# Patient Record
Sex: Female | Born: 1940 | Race: White | Hispanic: No | Marital: Married | Smoking: Never smoker
Health system: Southern US, Community
[De-identification: ages and names within clinical notes are randomized; demographics above are authoritative.]

## PROBLEM LIST (undated history)

## (undated) DIAGNOSIS — I10 Essential (primary) hypertension: Secondary | ICD-10-CM

## (undated) DIAGNOSIS — I499 Cardiac arrhythmia, unspecified: Secondary | ICD-10-CM

## (undated) DIAGNOSIS — IMO0002 Reserved for concepts with insufficient information to code with codable children: Secondary | ICD-10-CM

## (undated) DIAGNOSIS — N321 Vesicointestinal fistula: Secondary | ICD-10-CM

## (undated) DIAGNOSIS — K5790 Diverticulosis of intestine, part unspecified, without perforation or abscess without bleeding: Secondary | ICD-10-CM

## (undated) DIAGNOSIS — M1990 Unspecified osteoarthritis, unspecified site: Secondary | ICD-10-CM

## (undated) HISTORY — PX: BREAST BIOPSY: SHX20

## (undated) HISTORY — PX: TONSILLECTOMY AND ADENOIDECTOMY: SUR1326

## (undated) HISTORY — PX: DILATION AND CURETTAGE OF UTERUS: SHX78

## (undated) HISTORY — DX: Essential (primary) hypertension: I10

## (undated) HISTORY — DX: Reserved for concepts with insufficient information to code with codable children: IMO0002

---

## 1994-02-17 HISTORY — PX: CHOLECYSTECTOMY: SHX55

## 1998-06-15 ENCOUNTER — Other Ambulatory Visit: Admission: RE | Admit: 1998-06-15 | Discharge: 1998-06-15 | Payer: Self-pay | Admitting: *Deleted

## 1999-08-28 ENCOUNTER — Other Ambulatory Visit: Admission: RE | Admit: 1999-08-28 | Discharge: 1999-08-28 | Payer: Self-pay | Admitting: Radiology

## 1999-09-25 ENCOUNTER — Encounter: Payer: Self-pay | Admitting: Geriatric Medicine

## 1999-09-25 ENCOUNTER — Inpatient Hospital Stay (HOSPITAL_COMMUNITY): Admission: EM | Admit: 1999-09-25 | Discharge: 1999-09-28 | Payer: Self-pay | Admitting: Internal Medicine

## 1999-11-15 ENCOUNTER — Other Ambulatory Visit: Admission: RE | Admit: 1999-11-15 | Discharge: 1999-11-15 | Payer: Self-pay | Admitting: *Deleted

## 2001-05-19 ENCOUNTER — Other Ambulatory Visit: Admission: RE | Admit: 2001-05-19 | Discharge: 2001-05-19 | Payer: Self-pay | Admitting: *Deleted

## 2010-03-10 ENCOUNTER — Encounter: Payer: Self-pay | Admitting: Family Medicine

## 2010-06-28 ENCOUNTER — Other Ambulatory Visit: Payer: Self-pay | Admitting: Obstetrics and Gynecology

## 2010-06-28 ENCOUNTER — Encounter (HOSPITAL_COMMUNITY): Payer: Medicare Other

## 2010-06-28 LAB — BASIC METABOLIC PANEL
BUN: 17 mg/dL (ref 6–23)
CO2: 29 mEq/L (ref 19–32)
Calcium: 9.5 mg/dL (ref 8.4–10.5)
Chloride: 92 mEq/L — ABNORMAL LOW (ref 96–112)
Creatinine, Ser: 0.66 mg/dL (ref 0.4–1.2)
GFR calc Af Amer: >60 mL/min (ref >60)
GFR calc non Af Amer: >60 mL/min (ref >60)
Glucose, Bld: 107 mg/dL — ABNORMAL HIGH (ref 70–99)
Potassium: 3.1 mEq/L — ABNORMAL LOW (ref 3.5–5.1)
Sodium: 132 mEq/L — ABNORMAL LOW (ref 135–145)

## 2010-06-28 LAB — CBC
HCT: 41.4 % (ref 36.0–46.0)
Hemoglobin: 14 g/dL (ref 12.0–15.0)
MCH: 28.9 pg (ref 26.0–34.0)
MCHC: 33.8 g/dL (ref 30.0–36.0)
MCV: 85.5 fL (ref 78.0–100.0)
Platelets: 305 10*3/uL (ref 150–400)
RBC: 4.84 MIL/uL (ref 3.87–5.11)
RDW: 12.8 % (ref 11.5–15.5)
WBC: 7.9 10*3/uL (ref 4.0–10.5)

## 2010-07-04 ENCOUNTER — Ambulatory Visit (HOSPITAL_COMMUNITY)
Admission: RE | Admit: 2010-07-04 | Discharge: 2010-07-04 | Disposition: A | Discharge status: Home / Self Care | Payer: Medicare Other | Source: Ambulatory Visit | Attending: Obstetrics and Gynecology | Admitting: Obstetrics and Gynecology

## 2010-07-04 ENCOUNTER — Other Ambulatory Visit: Payer: Self-pay | Admitting: Obstetrics and Gynecology

## 2010-07-04 DIAGNOSIS — Z01812 Encounter for preprocedural laboratory examination: Secondary | ICD-10-CM | POA: Insufficient documentation

## 2010-07-04 DIAGNOSIS — N950 Postmenopausal bleeding: Secondary | ICD-10-CM | POA: Insufficient documentation

## 2010-07-04 DIAGNOSIS — N840 Polyp of corpus uteri: Secondary | ICD-10-CM | POA: Insufficient documentation

## 2010-07-04 DIAGNOSIS — I10 Essential (primary) hypertension: Secondary | ICD-10-CM | POA: Insufficient documentation

## 2010-07-04 DIAGNOSIS — Z01818 Encounter for other preprocedural examination: Secondary | ICD-10-CM | POA: Insufficient documentation

## 2010-07-11 NOTE — H&P (Signed)
[name], Hays              ACCOUNT NO.:  [id number]  MEDICAL RECORD NO.:  [id number]           PATIENT TYPE:  LOCATION:                                 FACILITY:  PHYSICIAN:  Hal Morales, M.D.DATE OF BIRTH:  August 05, 1940  DATE OF ADMISSION:  07/03/2010 DATE OF DISCHARGE:                             HISTORY & PHYSICAL   HISTORY OF PRESENT ILLNESS:  The patient is a 70 year old white married female para 2-0-0-2 who presents for removal of a probable endometrial polyp.  The patient presented on April 25, 2010, complaining of a 1-year history of thick discharge after wiping.  She had had this on several occasions separated by many months; however, in February 2012, she started to have almost daily rusty watery discharge.  She knows that that bleeding has increased over the last week to some bright red bleeding.  She denied any nausea, vomiting, constipation, diarrhea, or abdominal pain.  She has had two urinary tract infections since September 2011. She underwent workup for this abnormal postmenopausal uterine bleeding, which included the following:  A Pap smear done on April 25, 2010, was negative for intraepithelial lesions or malignancy.  She did at the time of her examination have a cervical polyp, the pathology of which showed benign mixed endometrial endocervical polyp.  She underwent a sonohysterogram showing the uterus measuring 9.12 cm x 4.02 cm x 3.85 cm.  The endometrial thickness was 14 mm.  The ovaries were within normal limits.  There was an intracavitary polyp measuring 3.4 cm x 1.7 cm.  At that time, the recommendation was made for hysteroscopic polyp removal.  The patient was on route to vacation and opted to schedule this procedure subsequent to her medication.  GYNECOLOGICAL HISTORY:  The patient underwent menarche at age 27, but underwent menopause approximately 20 years ago.  She denies any history of abnormal Pap smears in the past.  She had not had  a Pap smear prior to her recent one over 10 years.  OBSTETRICAL HISTORY:  The patient had two spontaneous vaginal deliveries.  SURGICAL HISTORY:  Tonsillectomy and adenoidectomy and gallbladder removal.  MEDICAL HISTORY:  Chronic hypertension, currently under treatment.  FAMILY HISTORY:  Positive for hypertension and diabetes.  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  She does not smoke cigarettes and rarely drinks alcohol.  CURRENT MEDICATIONS:  Toprol-XL and hydrochlorothiazide.  DRUG SENSITIVITIES:  SULFA.  REVIEW OF SYSTEMS:  Negative except as mentioned above.  The patient did have an episode of bronchitis approximately a week ago and completed a Z- Pak on Jun 29, 2010.  PHYSICAL EXAMINATION:  GENERAL:  The patient is a well-developed white female in no acute distress. VITAL SIGNS:  Temperature is 97.6, respirations 14, pulse 72, height 5 feet 1/2 inch, weight 190 pounds, BMI 36, blood pressure 118/88. LUNGS:  Clear. HEART:  Regular rate and rhythm. ABDOMEN:  Protuberant with limitation on examination, but no tenderness or masses are palpated. PELVIC:  External genitalia, Bartholin, urethral, and Skene are within normal limits.  The vagina is atrophic.  The cervix is without gross lesions, status post  removal of the aforementioned 3 cm polyp.  The uterus does not feel significantly enlarged.  Adnexa, no palpable masses.  Rectovaginal, no masses.  IMPRESSION: 1. Over 1 year history of postmenopausal bleeding and spotting. 2. History of benign mixed endocervical and endometrial polyp. 3. Evidence of current endometrial polyp. 4. Chronic hypertension. 5. Sulfa allergy.  DISPOSITION:  A long discussion was held with the patient concerning recommendations for hysteroscopic polyp resection and uterine curettage. The risks of anesthesia, bleeding, infection, damage to adjacent organs, and uterine perforation were all reviewed with the patient, and  she seemed to understand and had her questions answered.  She wishes to proceed.  This will be done at [facility] on Jul 04, 2010.     Hal Morales, M.D.     VPH/MEDQ  D:  07/03/2010  T:  07/04/2010  Job:  [id number]  Electronically Signed by Dierdre Forth M.D. on 07/11/2010 11:21:10 PM

## 2010-07-11 NOTE — Consult Note (Signed)
  [name], [name]              ACCOUNT NO.:  [id number]  MEDICAL RECORD NO.:  [id number]           PATIENT TYPE:  O  LOCATION:  [facility]                          FACILITY:  [facility]  PHYSICIAN:  Hal Morales, M.D.DATE OF BIRTH:  1940/07/10  DATE OF CONSULTATION:  07/04/2010 DATE OF DISCHARGE:                                CONSULTATION   PREOPERATIVE DIAGNOSES:  Postmenopausal bleeding and endometrial mass.  POSTOPERATIVE DIAGNOSES:  Postmenopausal bleeding and endometrial mass.  OPERATION:  Hysteroscopy with resection of endometrial mass and D and C.  SURGEON:  Hal Morales, MD.  ANESTHESIA:  General and local.  FINDINGS:  The patient had multiple large endometrial masses and a fluffy endometrium in general.  PROCEDURE:  The patient was taken to the operating room after appropriate identification and placed on the operating table.  After the attainment of adequate general anesthesia, she was placed in a lithotomy position.  The perineum and vagina were prepped with multiple layers of Betadine and draped as a sterile field.  A red Robinson catheter was used to empty the bladder.  A Graves speculum was placed in the vagina and a paracervical block achieved with a total of 10 mL of 2% Xylocaine in the 5 and 7 o'clock positions.  A single-tooth tenaculum was placed on the anterior cervix.  The uterus was sounded to 8 cm.  The cervix was dilated to accommodate the diagnostic hysteroscope and the above-noted findings made and documented.  The operative hysteroscope was then placed and resection of the largest lesion begun.  The lesion was then removed with Randall stone forceps and curettage.  The hysteroscope was reinserted and the remainder of the endometrial lesions were removed with cautery.  Hemostasis seemed adequate.  The uterine bed was then curetted once again for a large amount of tissue.  Once the curettings were down to what appeared to be the base of the  endometrium, all instruments were removed from the vagina, and the patient awakened from general anesthesia, then taken to the recovery room in satisfactory condition having tolerated the procedure well with sponge and instrument counts correct.  Specimens to pathology endometrial mass, endometrial curettings, and polyp.  Discharge instructions are printed instructions for hysteroscopy, D and C from the [facility].  DISCHARGE MEDICATIONS:  Ibuprofen 600 mg p.o. q.6 h. for 3 days and then q.6 hours p.r.n. pain which was called in to [facility] with no refill.  FOLLOWUP:  The patient will follow up in 2 weeks with Dr. Pennie Rushing.     Hal Morales, M.D.     VPH/MEDQ  D:  07/04/2010  T:  07/04/2010  Job:  [id number]  Electronically Signed by Dierdre Forth M.D. on 07/11/2010 11:21:18 PM

## 2010-07-16 ENCOUNTER — Ambulatory Visit: Payer: Medicare Other | Attending: Gynecologic Oncology | Admitting: Gynecologic Oncology

## 2010-07-16 DIAGNOSIS — Z808 Family history of malignant neoplasm of other organs or systems: Secondary | ICD-10-CM | POA: Insufficient documentation

## 2010-07-16 DIAGNOSIS — C549 Malignant neoplasm of corpus uteri, unspecified: Secondary | ICD-10-CM | POA: Insufficient documentation

## 2010-07-16 DIAGNOSIS — Z803 Family history of malignant neoplasm of breast: Secondary | ICD-10-CM | POA: Insufficient documentation

## 2010-07-17 NOTE — Consult Note (Signed)
Joanna Hays, Joanna Hays              ACCOUNT NO.:  [id number]  MEDICAL RECORD NO.:  [id number]           PATIENT TYPE:  O  LOCATION:  DAY                          FACILITY:  [facility]  PHYSICIAN:  Laurette Schimke, MD     DATE OF BIRTH:  07-08-1940  DATE OF CONSULTATION: DATE OF DISCHARGE:                                CONSULTATION   REASON FOR VISIT:  Consult was requested by Dr. Pennie Rushing for evaluation of a grade II endometrioid adenocarcinoma.  HISTORY OF PRESENT ILLNESS:  This is a 70 year old gravida 2, para 2 female, last normal menstrual period approximately age of 58.  She reported vaginal spotting intermittently beginning in March 2011.  The spotting became more concerning and she initiated evaluation in March 2012.  An endometrial biopsy was collected on Jul 04, 2010, was demonstrated a grade II endometrioid adenocarcinoma.  This is in a background of polypoid endometrial fragments.  There is possibility that the tumor may be arising in a polyp.  An additional endometrial polyp was removed and showed complex atypical hyperplasia.  PAST MEDICAL HISTORY:  Hypertension for 10 years.  PAST SURGICAL HISTORY: 1. Cholecystectomy in 1996. 2. Tonsillectomy and adenoidectomy at the age of 30.  PAST GYN HISTORY:  Menarche occurred at age of 89 and was regular until menopause.  She reports oral contraceptive pill use for 2 years duration in the remote past and the use of a lapis lazuli for approximately 19 years.  She denies any history of abnormal Pap tests.  SOCIAL HISTORY:  She is a retired Publishing rights manager, previously worked with [employer].  She reports rare alcohol use and denies tobacco use.  Her children are alive and well.  She is accompanied by her husband.  FAMILY HISTORY:  Notable for maternal uncle with brain cancer in his 35s and the third cousin with breast cancer diagnosed in 72s.  ALLERGIES:  SULFA.  MEDICATIONS: 1. Toprol XL. 2.  Hydrochlorothiazide.  REVIEW OF SYSTEMS:  Intermittent vaginal spotting.  No weight loss.  No nausea, vomiting.  No diarrhea, constipation or changes in bowel habits. No abdominal distention, cough, shortness of breath, headache, hematochezia or hematuria.  No lower extremity edema.  PHYSICAL EXAMINATION:  GENERAL:  Well-developed pleasant female, in no acute distress. VITAL SIGNS:  Weight 186 pounds, height 5 feet 1 inch, blood pressure 138/72, pulse of 72. CHEST:  Clear to auscultation. HEART:  Regular rate and rhythm. ABDOMEN:  Soft, obese, nontender. BACK:  No CVA tenderness. PELVIC EXAMINATION:  Normal external genitalia, Bartholin's, urethra and scant blood within the vaginal vault.  No cervical or vaginal metastatic disease appreciated.  Soft cervix.  No parametrial induration.  No nodularity in the cul-de-sac. RECTAL EXAMINATION:  Good anal sphincter tone without any masses.  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  The options presented to her were that of surgical staging versus the other less recommended approaches such as primary radiation or hormonal therapy. Patient opts for minimally invasive surgical staging approach.  The proposed procedure is that of a robotic-assisted laparoscopic hysterectomy, bilateral salpingo-oophorectomy  and likely lymph node dissection.  The risks and benefits of the surgery were discussed with and her husband and are inclusive of infection, bleeding, damage to surrounding structures, prolonged hospitalization, reoperation.  All of her questions were answered to her satisfaction and she is aware that the procedure will be performed by Dr. Rockney Ghee on July 23, 2010.     Laurette Schimke, MD     WB/MEDQ  D:  07/16/2010  T:  07/16/2010  Job:  [id number]  cc:   Hal Morales, M.D. Fax: [phone]  Telford Nab, R.N. [address] [place], [place] [address]  Electronically Signed by Laurette Schimke MD on 07/17/2010 08:58:45 AM

## 2010-07-19 ENCOUNTER — Other Ambulatory Visit: Payer: Self-pay | Admitting: Gynecologic Oncology

## 2010-07-19 ENCOUNTER — Ambulatory Visit (HOSPITAL_COMMUNITY)
Admission: RE | Admit: 2010-07-19 | Discharge: 2010-07-19 | Disposition: A | Discharge status: Home / Self Care | Payer: Medicare Other | Source: Ambulatory Visit | Attending: Obstetrics & Gynecology | Admitting: Obstetrics & Gynecology

## 2010-07-19 ENCOUNTER — Encounter (HOSPITAL_COMMUNITY): Payer: Medicare Other

## 2010-07-19 ENCOUNTER — Other Ambulatory Visit: Payer: Self-pay | Admitting: Obstetrics & Gynecology

## 2010-07-19 DIAGNOSIS — Z01812 Encounter for preprocedural laboratory examination: Secondary | ICD-10-CM | POA: Insufficient documentation

## 2010-07-19 DIAGNOSIS — I10 Essential (primary) hypertension: Secondary | ICD-10-CM

## 2010-07-19 DIAGNOSIS — Z01811 Encounter for preprocedural respiratory examination: Secondary | ICD-10-CM

## 2010-07-19 DIAGNOSIS — Z01818 Encounter for other preprocedural examination: Secondary | ICD-10-CM | POA: Insufficient documentation

## 2010-07-19 HISTORY — PX: ABDOMINAL HYSTERECTOMY: SHX81

## 2010-07-19 LAB — COMPREHENSIVE METABOLIC PANEL
ALT: 23 U/L (ref 0–35)
Albumin: 4.2 g/dL (ref 3.5–5.2)
Alkaline Phosphatase: 123 U/L — ABNORMAL HIGH (ref 39–117)
Calcium: 9.8 mg/dL (ref 8.4–10.5)
Potassium: 3.7 mEq/L (ref 3.5–5.1)
Sodium: 137 mEq/L (ref 135–145)
Total Protein: 8.7 g/dL — ABNORMAL HIGH (ref 6.0–8.3)

## 2010-07-19 LAB — CBC
MCHC: 34.3 g/dL (ref 30.0–36.0)
Platelets: 269 10*3/uL (ref 150–400)
RDW: 13.1 % (ref 11.5–15.5)
WBC: 8 10*3/uL (ref 4.0–10.5)

## 2010-07-19 LAB — SURGICAL PCR SCREEN
MRSA, PCR: NEGATIVE
Staphylococcus aureus: NEGATIVE

## 2010-07-19 LAB — DIFFERENTIAL
Basophils Absolute: 0.1 10*3/uL (ref 0.0–0.1)
Basophils Relative: 1 % (ref 0–1)
Eosinophils Absolute: 0.2 10*3/uL (ref 0.0–0.7)
Eosinophils Relative: 2 % (ref 0–5)

## 2010-07-23 ENCOUNTER — Other Ambulatory Visit: Payer: Self-pay | Admitting: Gynecologic Oncology

## 2010-07-23 ENCOUNTER — Ambulatory Visit (HOSPITAL_COMMUNITY)
Admission: RE | Admit: 2010-07-23 | Discharge: 2010-07-24 | Disposition: A | Discharge status: Home / Self Care | Payer: Medicare Other | Source: Ambulatory Visit | Attending: Obstetrics & Gynecology | Admitting: Obstetrics & Gynecology

## 2010-07-23 DIAGNOSIS — E669 Obesity, unspecified: Secondary | ICD-10-CM | POA: Insufficient documentation

## 2010-07-23 DIAGNOSIS — N8000 Endometriosis of the uterus, unspecified: Secondary | ICD-10-CM | POA: Insufficient documentation

## 2010-07-23 DIAGNOSIS — D259 Leiomyoma of uterus, unspecified: Secondary | ICD-10-CM | POA: Insufficient documentation

## 2010-07-23 DIAGNOSIS — Z9089 Acquired absence of other organs: Secondary | ICD-10-CM | POA: Insufficient documentation

## 2010-07-23 DIAGNOSIS — I10 Essential (primary) hypertension: Secondary | ICD-10-CM | POA: Insufficient documentation

## 2010-07-23 DIAGNOSIS — C549 Malignant neoplasm of corpus uteri, unspecified: Secondary | ICD-10-CM | POA: Insufficient documentation

## 2010-07-23 DIAGNOSIS — Z79899 Other long term (current) drug therapy: Secondary | ICD-10-CM | POA: Insufficient documentation

## 2010-07-23 DIAGNOSIS — IMO0002 Reserved for concepts with insufficient information to code with codable children: Secondary | ICD-10-CM

## 2010-07-23 HISTORY — DX: Reserved for concepts with insufficient information to code with codable children: IMO0002

## 2010-07-23 LAB — CBC
HCT: 37.9 % (ref 36.0–46.0)
Hemoglobin: 13.1 g/dL (ref 12.0–15.0)
MCH: 28.9 pg (ref 26.0–34.0)
MCHC: 34.6 g/dL (ref 30.0–36.0)
RBC: 4.54 MIL/uL (ref 3.87–5.11)

## 2010-07-23 LAB — SAMPLE TO BLOOD BANK

## 2010-07-23 LAB — HEMOGLOBIN AND HEMATOCRIT, BLOOD
HCT: 36.6 % (ref 36.0–46.0)
Hemoglobin: 12.7 g/dL (ref 12.0–15.0)

## 2010-07-24 LAB — BASIC METABOLIC PANEL
CO2: 28 mEq/L (ref 19–32)
Calcium: 9 mg/dL (ref 8.4–10.5)
Chloride: 101 mEq/L (ref 96–112)
GFR calc Af Amer: >60 mL/min (ref >60)
Glucose, Bld: 177 mg/dL — ABNORMAL HIGH (ref 70–99)
Potassium: 3.9 mEq/L (ref 3.5–5.1)
Sodium: 135 mEq/L (ref 135–145)

## 2010-07-24 LAB — CBC
HCT: 33.9 % — ABNORMAL LOW (ref 36.0–46.0)
Hemoglobin: 11.8 g/dL — ABNORMAL LOW (ref 12.0–15.0)
MCHC: 34.8 g/dL (ref 30.0–36.0)
RBC: 4.08 MIL/uL (ref 3.87–5.11)

## 2010-07-25 NOTE — Discharge Summary (Signed)
Joanna Hays, Joanna Hays              ACCOUNT NO.:  [id number]  MEDICAL RECORD NO.:  [id number]  LOCATION:  1525                         FACILITY:  [facility]  PHYSICIAN:  Wyland Rastetter A. Clearance Coots, M.D.DATE OF BIRTH:  October 04, 1940  DATE OF ADMISSION:  07/23/2010 DATE OF DISCHARGE:  07/24/2010                              DISCHARGE SUMMARY   ADMITTING DIAGNOSIS:  Grade 2 endometrial cancer.  DISCHARGE DIAGNOSES:  Grade 2 endometrial cancer status post staging, robotic-assisted total laparoscopic hysterectomy and bilateral salpingo- oophorectomy, with bilateral pelvic and bilateral para-aortic lymphadenectomy.  DISPOSITION:  Discharged home in good condition.  REASON FOR ADMISSION:  This 70 year old female, G2, P2, presents for consultation requested by Dr. Hal Morales for evaluation of a grade 2 endometrioid adenocarcinoma.  The patient's last menstrual period was approximately age 63.  She reported vaginal spotting intermittently beginning in March of 2011.  The spotting became more concerning and she initiated evaluation in March of 2012.  An endometrial biopsy was collected on Jul 04, 2010 and demonstrated a grade 2 endometrioid adenocarcinoma.  This was in the background of polypoid endometrial fragment.  There was a possibility that the tumor was arising in a polyp and an additional endometrial polyp was removed and showed complex atypical hyperplasia.  PAST MEDICAL HISTORY:  Surgeries, cholecystectomy in 1996, tonsillectomy and adenoidectomy at age 42.  Illnesses, hypertension for 10 years.  MEDICATIONS:  Toprol XL, hydrochlorothiazide.  ALLERGIES:  SULFA.  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  She reports rare alcohol use and denies tobacco use.  Her children are alive and well, and she is accompanied by her husband  FAMILY HISTORY:  Notable for maternal uncle with brain cancer in his 92s and  third cousin with breast cancer diagnosed in her 59s.  PAST GYN HISTORY:  Menarche occurred at age 64 and was regular until menopause.  She reports oral contraceptive pill use for 2 years in the remote past and the use of lapis lazuli for approximately 19 years.  She denies any history of abnormal Pap smears.  REVIEW OF SYSTEMS:  Intermittent vaginal spotting with no weight loss, no nausea or vomiting, no diarrhea or constipation or changes in her bowel habits.  No abdominal distention, cough, shortness of breath, headache, hematochezia or hematuria.  No lower extremity edema.  PHYSICAL EXAMINATION:  GENERAL:  Well nourished, well-developed and pleasant female, in no acute distress. VITAL SIGNS:  Weight 186 pounds, height 5 feet 1 inch, blood pressure 138/72, pulse 72. LUNGS:  Clear to auscultation bilaterally. HEART:  Regular rate and rhythm. ABDOMEN:  Obese, soft, nontender. BACK:  No CVA tenderness. PELVIC EXAMINATION:  Normal external genitalia.  Bartholin's, urethra and Skene's glands are all normal.  Scant blood within the vaginal vault.  No cervical or vaginal metastatic disease appreciated.  Cervix was soft with no parametrial induration.  No nodularity in the cul-de- sac. RECTAL EXAMINATION:  Good anal sphincter tone without any masses.  ADMITTING LABORATORY DATA:  Hemoglobin 13.1, hematocrit 37.9, white blood cell count 12,100, platelets 229,000.  Sodium 137, potassium 3.7, glucose 89, BUN 21, creatinine 0.72.  SGOT 22, SGPT 23.  Chest x-ray was within normal limits.  EKG was within normal limits.  HOSPITAL COURSE:  The patient underwent a staging robotic-assisted total laparoscopic hysterectomy and bilateral salpingo-oophorectomy with bilateral pelvic and bilateral para-aortic lymphadenectomies.  There were no intraoperative complications.  Postoperative course was uncomplicated.  The patient was discharged home on postop day #1 in good condition.  DISCHARGE  LABORATORY DATA:  Hemoglobin 11.8, hematocrit 33.9, white blood cell count 16,800, platelets 269,000.  DISCHARGE DISPOSITION:  MEDICATIONS:  Percocet and ibuprofen prescribed for pain.  Continue meds there were taken prior to admission to the hospital. Routine written instructions were given for discharge after laparoscopic hysterectomy.  The patient is to call the [facility] office for a followup appointment.     Yoseph Haile A. Clearance Coots, M.D.     CAH/MEDQ  D:  07/24/2010  T:  07/24/2010  Job:  [id number]  cc:   Hal Morales, M.D. Fax: [phone]  Electronically Signed by Coral Ceo M.D. on 07/25/2010 01:29:31 PM

## 2010-07-25 NOTE — Op Note (Signed)
NAMEJABREE, Joanna Hays              ACCOUNT NO.:  [id number]  MEDICAL RECORD NO.:  [id number]  LOCATION:  DAYL                         FACILITY:  [facility]  PHYSICIAN:  Aziya Arena A. Duard Brady, MD    DATE OF BIRTH:  01-24-1941  DATE OF PROCEDURE:  07/23/2010 DATE OF DISCHARGE:                              OPERATIVE REPORT   PREOPERATIVE DIAGNOSIS:  Grade 2 endometrioid adenocarcinoma.  POSTOPERATIVE. DIAGNOSIS:  Grade 2 endometrioid adenocarcinoma.  PROCEDURE:  Total robotic hysterectomy, bilateral salpingo-oophorectomy, bilateral pelvic and paraaortic lymph node dissection.  SURGEON: 1. Zaleah Ternes A. Duard Brady, MD 2. Roseanna Rainbow, MD  ASSISTANT:  Telford Nab, RN  ANESTHESIA:  General.  ANESTHESIOLOGIST:  Hezzie Bump. Rose, MD  BLOOD LOSS:  100 mL.  URINE OUTPUT:  280 mL.  IV FLUIDS:  2000 mL.  SPECIMEN:  Cervix, uterus, bilateral tubes and ovaries, bilateral pelvic and paraaortic lymph nodes.  DISPOSITION:  Specimens to pathology.  COMPLICATIONS:  None.  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  A 6 cm left-sided hydrosalpinx.  No gross lymphadenopathy.  Fairly significant intra-abdominal obesity.  DESCRIPTION OF PROCEDURE:  The patient was seen in the preoperative area and identified as self.  Informed consent was signed on the chart at the time of the procedure.  The patient was taken to the operating room, placed in the supine position with arms tucked while she was awake to her comfort level to ensure all appropriate precautions were taken. General anesthesia was then induced.  She was then placed in dorsal lithotomy position with all appropriate precautions.  OG tube was then placed.  Time-out was performed to confirm the patient, the procedure, antibiotic, and allergy status.  The perineum was cleansed with Betadine.  Sterile speculum was placed  in the vagina.  The cervix was grasped with single-tooth tenaculum.  It was dilated without difficulty and the ZUMI with a medium balloon was placed.  Foley catheter was inserted in the bladder in sterile condition.  The abdomen was then prepped with ChloraPrep.  Shoulder blocks were placed in usual fashion with Gelfoam.  After waiting 3 minutes, the patient was then draped. Time-out was again performed to confirm the patient, the procedure, antibiotic, allergy status.  Staff was introduced to themselves.  After confirming OG tube placement and to suction, the skin incision 2 cm below the infracostal margin on the left in the midclavicular line was made.  Using a 5 mm Optiview port, we placed the trocar into the abdomen.  The abdomen was insufflated with CO2 gas.  At this point and all points during the case, the patient's intra-abdominal pressure did not increase over 50 mmHg.  She was then placed into deep Trendelenburg position.  A 10/12 camera port was placed 23 cm above the pubic symphysis.  Bilateral 8-mm ports were placed under visualization 10 cm and 15 degrees south of that port and a fourth arm was placed on the left lower quadrant 2 cm medial and superior to the ACIS.  The small bowel was folded on its mesentery to expose the root of the mesentery and  the periaortic region.  The robot was undocked.  First our attention was drawn to the right paraaortic lymph node dissection.  An incision was made over the right side of the common iliac artery and extended to the level of the duodenum.  The ureter was identified in the retroperitoneum and the fourth arm of the robot was used to hold this away.  The nodal bundle extending over the midway of the common superiorly to the duodenum was taken off the artery with pinpoint cautery and meticulous dissection.  It was then dissected off the vena cava.  The area was noted to be hemostatic.  The specimen was then delivered through the 10/12  assistant port.  Our attention was then drawn to the left para-aortics.  We extended the incision that was made along the aorta towards the left common.  This allowed visualization and access to the retroperitoneal space on her left side.  The ureter was identified, the assistant grasper then held the ureter out of the way. The nodal bundle between the ureter and the midportion of the aorta was then removed using pinpoint dissection and electrocautery and delivered through the 10/12 port.  Our attention was then drawn to the pelvis. The adhesive disease of the rectosigmoid colon to the uterus, the round ligament and the left adnexa was taken down using sharp dissection.  The round ligament was transected on the patient's right side.  The anterior and posterior leaves of the broad ligament were opened.  The ureter was identified.  A window was made between the IP and the ureter after dissecting over in the pararectal space.  The IP was skeletonized, coagulated and transected.  We then skeletonized the uterine vessels and created the bladder flap anteriorly.  The uterine vessels were then coagulated, transected. and a C-loop was created.  Our attention was drawn to the left side.  The hydrosalpinx was dissected off the left pelvic sidewall.  The round ligament was then transected.  The anterior and posterior leaves of broad ligament were opened.  The ureter was identified.  A window was made between the IP and the ureter.  The IP was coagulated and transected.  We then completed the skeletonization of the uterine vessels on her left side.  The bladder flap was adequate. The area was cauterized with regards to the uterine vessels and transected using monopolar cautery.  The pneumo-occluder was then insufflated.  The colpotomy was performed without difficulty and the uterus was delivered through the vagina.  The pneumo-occluder balloon was then closed.  The paravesical space was opened on  the patient's right side and the fourth arm was used to keep the paravesical space open.  The nodal bundle exiting over the external iliac artery to the circumflex iliac vein was taken down using sharp dissection.  The genitofemoral nerve was seen and spared.  Our attention was then drawn inferiorly.  The obturator nerve was then identified.  Nodal bundle superior to this was removed without difficulty.  There was some difficulty reaching the deep portion of the nodal bundle under the external iliac vein where it met the internal iliac vein as it was quite prominent vein.  The nodal bundle was removed.  There was some bleeding and a Ray-Tec was placed in that area.  A similar procedure was performed on the patient's left side encompassing the external, internal, and obturator nodal bundles.  The obturator nerve was well seen throughout the case.  The genitofemoral nerve was spared and the ureter  was noted to be coursing well medially. The nodal bundles bilaterally were placed in EndoCatch bag.  We drew our attention again to the right pelvic lymph node area.  There was noted to be some bleeding off the pelvic sidewall.  We then took the vessels off the pelvic sidewall and were able to see the area that was bleeding. However, there was no pedicle for which to coagulate.  A Ray-Tec was then placed and pressure was held.  All specimens were delivered through the vagina including the 3 Ray-Tec and the 2 pelvic lymph node bundles. Removal of the Ray-Tec revealed significant improvement and hemostasis. FloSeal was then placed and pressure was held for 3 minutes and there was noted to be adequate hemostasis.  The pedicle was inspected under low flow and was noted to be hemostatic.  The Ray-Tec were then delivered through the vaginal cuff.  All Ray-Tec counts were correct. The vaginal cuff was closed in a running fashion using a 0 Vicryl on a CT-1 suture.  The needle was then removed from the  abdomen.  All instruments were removed under direct visualization as were the trocars. Under flow with pressure of 3 mmHg, the left pelvic lymph node and right pelvic lymph node areas were identified and noted to be hemostatic.  The camera was removed and all CO2 gas was removed from the patient's abdomen.  Using a 0 Vicryl on a UR-6, deep sutures in the subcu tissues were placed for the 10/12 ports.  The skin was closed using 4-0 Vicryl. Steri-Strips and Benzoin were placed.  The vagina was swabbed and noted to be hemostatic.  The shoulder blocks were removed and the area was atraumatic.  The patient tolerated the procedure well, was taken to recovery room in stable condition.  All instrument, needle, Ray-Tec counts were correct x2.     Shedric Fredericks A. Duard Brady, MD     PAG/MEDQ  D:  07/23/2010  T:  07/23/2010  Job:  [id number]  cc:   Roseanna Rainbow, M.D. Fax: [phone]  Telford Nab, R.N. [address] [place], [place] [address]  Hal Morales, M.D. Fax: [phone]  Electronically Signed by Cleda Mccreedy MD on 07/25/2010 09:13:48 AM

## 2010-08-29 ENCOUNTER — Ambulatory Visit: Payer: Medicare Other | Attending: Gynecologic Oncology | Admitting: Gynecologic Oncology

## 2010-08-29 DIAGNOSIS — Z90710 Acquired absence of both cervix and uterus: Secondary | ICD-10-CM | POA: Insufficient documentation

## 2010-08-29 DIAGNOSIS — C549 Malignant neoplasm of corpus uteri, unspecified: Secondary | ICD-10-CM | POA: Insufficient documentation

## 2010-08-29 DIAGNOSIS — Z9089 Acquired absence of other organs: Secondary | ICD-10-CM | POA: Insufficient documentation

## 2010-08-29 DIAGNOSIS — I10 Essential (primary) hypertension: Secondary | ICD-10-CM | POA: Insufficient documentation

## 2010-08-29 DIAGNOSIS — Z9079 Acquired absence of other genital organ(s): Secondary | ICD-10-CM | POA: Insufficient documentation

## 2010-09-02 NOTE — Consult Note (Signed)
  Joanna Hays, Joanna Hays              ACCOUNT NO.:  [id number]  MEDICAL RECORD NO.:  [id number]  LOCATION:  GYN                          FACILITY:  [facility]  PHYSICIAN:  Laurette Schimke, MD     DATE OF BIRTH:  [birth year]  DATE OF CONSULTATION:  08/29/2010 DATE OF DISCHARGE:                                CONSULTATION   REASON FOR VISIT:  Postoperative check for stage I-A grade 2 endometrioid adenocarcinoma.  HISTORY OF PRESENT ILLNESS:  This is a 70 year old who presented with vaginal bleeding in March 2011.  She was evaluated by Dr. Pennie Rushing and noted to have a grade 2 endometrioid adenocarcinoma on dilation and curettage.  On July 23, 2010, Dr. Duard Brady was the primary surgeon for robotic assisted laparoscopic hysterectomy, bilateral salpingo- oophorectomy, bilateral pelvic and periaortic lymph node dissection. Final pathology was consistent with a microscopic foci of endometrioid adenocarcinoma grade 1 confined within the endometrium without any angiolymphatic invasion.  All lymph nodes were negative.  There was no myometrial invasion.  As such, she is stage I-A grade 2 endometrioid endometrial cancer.  Postoperatively, Joanna Hays reports some spotting that has since resolved.  PAST MEDICAL HISTORY:  Hypertension for 10 years and stage I-A grade 2 endometrial cancer.  PAST SURGICAL HISTORY:  Cholecystectomy 1996, tonsillectomy and adenoidectomy at age of 48, robotic assisted laparoscopic hysterectomy, bilateral salpingo-oophorectomy, bilateral pelvic and periaortic lymph node dissection in June 2012.  PAST GYNECOLOGIC HISTORY:  No changes.  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  FAMILY HISTORY:  No interval changes.  REVIEW OF SYSTEMS:  Vaginal spotting is resolved.  No vaginal discharge. Denies bleeding from the bladder or rectum.  No nausea, vomiting, diarrhea, constipation, abdominal pain.  PHYSICAL EXAMINATION:   GENERAL:  Well-developed female in no acute distress. VITAL SIGNS:  Blood pressure 100/78, pulse of 64, temperature 98.5, height 5 feet 2 inches, weight 186 pounds. CHEST:  Clear to auscultation. ABDOMEN:  Soft, nontender.  Robotic port sites nontender without erythema or evidence of a hernia. PELVIC:  Normal external genitalia, Bartholin's, urethral and Skene's. Vaginal cuff is intact.  No pelvic masses are appreciated.  IMPRESSION:  Joanna Hays is 5 weeks status post robotic assisted laparoscopic hysterectomy, bilateral salpingo-oophorectomy, bilateral pelvic and periaortic lymph node dissection for stage I-A, grade 2 endometrial cancer.  She is doing very well.  I have advised her to follow up with Dr. Pennie Rushing in 6 months and with GYN Oncology Service in 1 year.  Annual Paps are necessary, bi-annual pelvic examinations are necessary.  Imaging or biomarkers assays are only necessary as indicated by physical or clinical symptomatology.     Laurette Schimke, MD     WB/MEDQ  D:  08/29/2010  T:  08/29/2010  Job:  [id number]  cc:   Hal Morales, M.D. Fax: [phone] Telford Nab, R.N. [address] [place], [place] [address]  Electronically Signed by Laurette Schimke MD on 09/02/2010 10:00:55 AM

## 2011-03-03 ENCOUNTER — Other Ambulatory Visit: Payer: Self-pay | Admitting: Internal Medicine

## 2011-03-03 DIAGNOSIS — R1032 Left lower quadrant pain: Secondary | ICD-10-CM

## 2011-03-04 ENCOUNTER — Ambulatory Visit
Admission: RE | Admit: 2011-03-04 | Discharge: 2011-03-04 | Disposition: A | Discharge status: Home / Self Care | Payer: Medicare Other | Source: Ambulatory Visit | Attending: Internal Medicine | Admitting: Internal Medicine

## 2011-03-04 DIAGNOSIS — R1032 Left lower quadrant pain: Secondary | ICD-10-CM

## 2011-03-04 MED ORDER — IOHEXOL 300 MG/ML  SOLN
100.0000 mL | Freq: Once | INTRAMUSCULAR | Status: AC | PRN
  Administered 2011-03-04: 100 mL via INTRAVENOUS

## 2011-03-12 ENCOUNTER — Ambulatory Visit (HOSPITAL_COMMUNITY)
Admission: RE | Admit: 2011-03-12 | Discharge: 2011-03-12 | Disposition: A | Discharge status: Home / Self Care | Payer: Medicare Other | Source: Ambulatory Visit | Attending: Geriatric Medicine | Admitting: Geriatric Medicine

## 2011-03-12 DIAGNOSIS — M7989 Other specified soft tissue disorders: Secondary | ICD-10-CM | POA: Insufficient documentation

## 2011-03-12 DIAGNOSIS — R609 Edema, unspecified: Secondary | ICD-10-CM

## 2011-07-15 ENCOUNTER — Encounter: Payer: Self-pay | Admitting: Gynecologic Oncology

## 2011-07-17 ENCOUNTER — Ambulatory Visit: Payer: Medicare Other | Attending: Gynecologic Oncology | Admitting: Gynecologic Oncology

## 2011-07-17 ENCOUNTER — Other Ambulatory Visit (HOSPITAL_COMMUNITY)
Admission: RE | Admit: 2011-07-17 | Discharge: 2011-07-17 | Disposition: A | Discharge status: Home / Self Care | Payer: Medicare Other | Source: Ambulatory Visit | Attending: Gynecologic Oncology | Admitting: Gynecologic Oncology

## 2011-07-17 ENCOUNTER — Encounter: Payer: Self-pay | Admitting: Gynecologic Oncology

## 2011-07-17 VITALS — BP 122/80 | HR 64 | Temp 97.5°F | Resp 14 | Ht 62.0 in | Wt 186.8 lb

## 2011-07-17 DIAGNOSIS — Z124 Encounter for screening for malignant neoplasm of cervix: Secondary | ICD-10-CM | POA: Insufficient documentation

## 2011-07-17 DIAGNOSIS — C541 Malignant neoplasm of endometrium: Secondary | ICD-10-CM | POA: Insufficient documentation

## 2011-07-17 DIAGNOSIS — C549 Malignant neoplasm of corpus uteri, unspecified: Secondary | ICD-10-CM | POA: Insufficient documentation

## 2011-07-17 DIAGNOSIS — I10 Essential (primary) hypertension: Secondary | ICD-10-CM | POA: Insufficient documentation

## 2011-07-17 DIAGNOSIS — Z9079 Acquired absence of other genital organ(s): Secondary | ICD-10-CM | POA: Insufficient documentation

## 2011-07-17 DIAGNOSIS — Z90710 Acquired absence of both cervix and uterus: Secondary | ICD-10-CM | POA: Insufficient documentation

## 2011-07-17 NOTE — Progress Notes (Signed)
This is a 71 year old who presented with  vaginal bleeding in March 2011. She was evaluated by Dr. Pennie Rushing and  noted to have a grade 2 endometrioid adenocarcinoma on dilation and  curettage. On July 23, 2010, Dr. Duard Brady was the primary surgeon for  robotic assisted laparoscopic hysterectomy, bilateral salpingo-  oophorectomy, bilateral pelvic and periaortic lymph node dissection.  Final pathology was consistent with a microscopic foci of endometrioid  adenocarcinoma grade 1 confined within the endometrium without any  angiolymphatic invasion. All lymph nodes were negative. There was no  myometrial invasion. As such, she is stage I-A grade 2 endometrioid  endometrial cancer.    PAST MEDICAL HISTORY: Hypertension for 10 years and stage I-A grade 2  endometrial cancer.  Diverticulitis with microperforation 02/2011  PAST SURGICAL HISTORY: Cholecystectomy 1996, tonsillectomy and  adenoidectomy at age of 60, robotic assisted laparoscopic hysterectomy,  bilateral salpingo-oophorectomy, bilateral pelvic and periaortic lymph  node dissection in June 2012.   PAST GYNECOLOGIC HISTORY: No changes.   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   FAMILY HISTORY: No interval changes.   REVIEW OF SYSTEMS:  No vaginal bleeding or discharge.  Denies bleeding from the bladder or rectum. No nausea, vomiting,  diarrhea, constipation, abdominal pain, sob weight loss, abdominal bloating.  Otherwise 10 point ROS is negative PHYSICAL EXAMINATION: GENERAL: Well-developed female in no acute  distress.  VITAL SIGNS:.BP 122/80  Pulse 64  Temp(Src) 97.5 F (36.4 C) (Oral)  Resp 14  Ht 5\' 2"  (1.575 m)  Wt 186 lb 12.8 oz (84.732 kg)  BMI 34.17 kg/m2 CHEST: Clear to auscultation.  ABDOMEN: Soft, nontender. Robotic port sites nontender without  erythema or evidence of a hernia.  PELVIC: Normal external genitalia, Bartholin's, urethral and Skene's.    Vaginal cuff is intact. No pelvic masses are appreciated.  RECTAL;  Good tone, no masses, no tenderness.  IMPRESSION: Joanna Hays is 1 year status post robotic assisted  laparoscopic hysterectomy, bilateral salpingo-oophorectomy, bilateral  pelvic and periaortic lymph node dissection for stage I-A, grade 2  endometrial cancer.   She Is NED  Follow up with Dr. Pennie Rushing in 6 months and with GYN Oncology Service in 1 year.  Annual Pap collected today. Annual bi-annual pelvic examinations are necessary. Imaging or biomarkers assays are only necessary as indicated by physical or  clinical symptomatology.  Signs and symptoms of recurrence d/w patient

## 2011-07-17 NOTE — Patient Instructions (Signed)
No evidence of disease  Follow up with Dr. Pennie Rushing in 6 months and with GYN Oncology Service in 1 year.  Annual Pap collected today. Annual bi-annual pelvic examinations are necessary. Imaging or biomarkers assays are only necessary as indicated by physical or  clinical symptomatology.  Signs and symptoms of recurrence d/w patinet

## 2011-07-22 ENCOUNTER — Telehealth: Payer: Self-pay | Admitting: Gynecologic Oncology

## 2011-07-22 NOTE — Telephone Encounter (Signed)
Pt notified about pap results: negative.  No questions or concerns voiced. 

## 2012-03-03 ENCOUNTER — Encounter: Payer: Self-pay | Admitting: Obstetrics and Gynecology

## 2012-03-03 ENCOUNTER — Ambulatory Visit: Payer: Medicare Other | Admitting: Obstetrics and Gynecology

## 2012-03-03 VITALS — BP 134/70 | HR 60 | Ht 60.0 in | Wt 192.0 lb

## 2012-03-03 DIAGNOSIS — C541 Malignant neoplasm of endometrium: Secondary | ICD-10-CM

## 2012-03-03 NOTE — Progress Notes (Signed)
Subjective:  Last Pap: 06/2011 per pt at cancer center WNL: Yes Regular Periods:no Contraception: post menopausal   Monthly Breast exam:yes Tetanus<59yrs:yes Nl.Bladder Function:yes Daily BMs:yes Healthy Diet:yes Calcium:no Mammogram:yes Date of Mammogram: 2012 per pt Exercise:yes Have often Exercise: swim class 3 days per week and walking  Seatbelt: yes Abuse at home: no Stressful work:no Sigmoid-colonoscopy: Pt has not had one.  Had CT scan with diverticulitis and abscess in 02/2011 Bone Density: Yes, a few years ago per pt PCP: Dr. Pete Glatter Change in PMH: no changes Change in [facility]: no changes  Joanna Hays is a 72 y.o. female G2P2 who presents for annual exam.  The patient has no complaints today.   The following portions of the patient's history were reviewed and updated as appropriate: allergies, current medications, past family history, past medical history, past social history, past surgical history and problem list.  Review of Systems Pertinent items are noted in HPI. Gastrointestinal:No change in bowel habits, no abdominal pain, no rectal bleeding Genitourinary:negative for dysuria, frequency, hematuria, nocturia and urinary incontinence    Objective:     BP 134/70  Pulse 60  Ht 5' (1.524 m)  Wt 192 lb (87.091 kg)  BMI 37.50 kg/m2  Weight:  Wt Readings from Last 1 Encounters:  03/03/12 192 lb (87.091 kg)     BMI: Body mass index is 37.50 kg/(m^2). General Appearance: Alert, appropriate appearance for age. No acute distress HEENT: Grossly normal Neck / Thyroid: Supple, no masses, nodes or enlargement Lungs: clear to auscultation bilaterally Back: No CVA tenderness Breast Exam: No masses or nodes.No dimpling, nipple retraction or discharge. Cardiovascular: Regular rate and rhythm. S1, S2, no murmur Gastrointestinal: Soft, non-tender, no masses or organomegaly Pelvic Exam: Vulva and vagina appear atrophic.  Vaginal vault is well healed and suspended.  Bimanual exam reveals surgically absent uterus and adnexa. Rectovaginal: normal rectal, no masses Lymphatic Exam: Non-palpable nodes in neck, clavicular, axillary, or inguinal regions Skin: no rash or abnormalities Neurologic: Normal gait and speech, no tremor  Psychiatric: Alert and oriented, appropriate affect.    Urinalysis:Not done    Assessment:    endometrial cancer s/p robotic hyst and adnexectomy.  NED    Plan:   mammogram pap smear DUE 06/2102 return FOR pap    Dierdre Forth MD

## 2013-05-04 ENCOUNTER — Other Ambulatory Visit: Payer: Self-pay | Admitting: Gastroenterology

## 2013-12-19 ENCOUNTER — Encounter: Payer: Self-pay | Admitting: Obstetrics and Gynecology

## 2015-04-04 ENCOUNTER — Other Ambulatory Visit: Payer: Self-pay | Admitting: Obstetrics and Gynecology

## 2015-04-04 DIAGNOSIS — R921 Mammographic calcification found on diagnostic imaging of breast: Secondary | ICD-10-CM

## 2015-04-04 DIAGNOSIS — R928 Other abnormal and inconclusive findings on diagnostic imaging of breast: Secondary | ICD-10-CM

## 2015-04-10 ENCOUNTER — Other Ambulatory Visit: Payer: Self-pay | Admitting: Obstetrics and Gynecology

## 2015-04-10 ENCOUNTER — Ambulatory Visit
Admission: RE | Admit: 2015-04-10 | Discharge: 2015-04-10 | Disposition: A | Discharge status: Home / Self Care | Payer: Medicare Other | Source: Ambulatory Visit | Attending: Obstetrics and Gynecology | Admitting: Obstetrics and Gynecology

## 2015-04-10 DIAGNOSIS — R928 Other abnormal and inconclusive findings on diagnostic imaging of breast: Secondary | ICD-10-CM

## 2015-04-10 DIAGNOSIS — R921 Mammographic calcification found on diagnostic imaging of breast: Secondary | ICD-10-CM

## 2015-04-18 ENCOUNTER — Other Ambulatory Visit: Payer: Self-pay | Admitting: Obstetrics and Gynecology

## 2015-04-18 DIAGNOSIS — R921 Mammographic calcification found on diagnostic imaging of breast: Secondary | ICD-10-CM

## 2015-04-18 DIAGNOSIS — R928 Other abnormal and inconclusive findings on diagnostic imaging of breast: Secondary | ICD-10-CM

## 2015-04-19 ENCOUNTER — Ambulatory Visit
Admission: RE | Admit: 2015-04-19 | Discharge: 2015-04-19 | Disposition: A | Discharge status: Home / Self Care | Payer: Medicare Other | Source: Ambulatory Visit | Attending: Obstetrics and Gynecology | Admitting: Obstetrics and Gynecology

## 2015-04-19 ENCOUNTER — Other Ambulatory Visit: Payer: Self-pay | Admitting: Obstetrics and Gynecology

## 2015-04-19 DIAGNOSIS — R928 Other abnormal and inconclusive findings on diagnostic imaging of breast: Secondary | ICD-10-CM

## 2015-04-19 DIAGNOSIS — R921 Mammographic calcification found on diagnostic imaging of breast: Secondary | ICD-10-CM

## 2015-09-14 ENCOUNTER — Other Ambulatory Visit: Payer: Self-pay | Admitting: Obstetrics and Gynecology

## 2015-09-14 DIAGNOSIS — R921 Mammographic calcification found on diagnostic imaging of breast: Secondary | ICD-10-CM

## 2015-10-29 ENCOUNTER — Ambulatory Visit
Admission: RE | Admit: 2015-10-29 | Discharge: 2015-10-29 | Disposition: A | Discharge status: Home / Self Care | Payer: Medicare Other | Source: Ambulatory Visit | Attending: Obstetrics and Gynecology | Admitting: Obstetrics and Gynecology

## 2015-10-29 DIAGNOSIS — R921 Mammographic calcification found on diagnostic imaging of breast: Secondary | ICD-10-CM

## 2016-03-21 ENCOUNTER — Other Ambulatory Visit: Payer: Self-pay | Admitting: Obstetrics and Gynecology

## 2016-03-21 DIAGNOSIS — R921 Mammographic calcification found on diagnostic imaging of breast: Secondary | ICD-10-CM

## 2016-04-03 ENCOUNTER — Ambulatory Visit
Admission: RE | Admit: 2016-04-03 | Discharge: 2016-04-03 | Disposition: A | Discharge status: Home / Self Care | Payer: Medicare Other | Source: Ambulatory Visit | Attending: Obstetrics and Gynecology | Admitting: Obstetrics and Gynecology

## 2016-04-03 DIAGNOSIS — R921 Mammographic calcification found on diagnostic imaging of breast: Secondary | ICD-10-CM

## 2016-12-09 ENCOUNTER — Other Ambulatory Visit: Payer: Self-pay | Admitting: Orthopedic Surgery

## 2017-01-13 NOTE — Pre-Procedure Instructions (Signed)
[place]  01/13/2017      [place], [place] [address] Phone: [phone] Fax: [phone]    Your procedure is scheduled on Monday December 3.  Report to [facility] Admitting at 8:00 A.M.  Call this number if you have problems the morning of surgery:  [phone]   Remember:  Do not eat food or drink liquids after midnight.  Take these medicines the morning of surgery with A SIP OF WATER: Metoprolol (toprol-XL)  7 days prior to surgery STOP taking any Aspirin(unless otherwise instructed by your surgeon), Aleve, Naproxen, Ibuprofen, Motrin, Advil, Goody's, BC's, all herbal medications, fish oil, and all vitamins    Do not wear jewelry, make-up or nail polish.  Do not wear lotions, powders, or perfumes, or deoderant.  Do not shave 48 hours prior to surgery.  Men may shave face and neck.  Do not bring valuables to the hospital.  [facility] is not responsible for any belongings or valuables.  Contacts, dentures or bridgework may not be worn into surgery.  Leave your suitcase in the car.  After surgery it may be brought to your room.  For patients admitted to the hospital, discharge time will be determined by your treatment team.  Patients discharged the day of surgery will not be allowed to drive home.   Special instructions:    [place]- Preparing For Surgery  Before surgery, you can play an important role. Because skin is not sterile, your skin needs to be as free of germs as possible. You can reduce the number of germs on your skin by washing with CHG (chlorahexidine gluconate) Soap before surgery.  CHG is an antiseptic cleaner which kills germs and bonds with the skin to continue killing germs even after washing.  Please do not use if you have an allergy to CHG or antibacterial soaps. If your skin becomes reddened/irritated stop using the CHG.  Do not shave  (including legs and underarms) for at least 48 hours prior to first CHG shower. It is OK to shave your face.  Please follow these instructions carefully.   1. Shower the NIGHT BEFORE SURGERY and the MORNING OF SURGERY with CHG.   2. If you chose to wash your hair, wash your hair first as usual with your normal shampoo.  3. After you shampoo, rinse your hair and body thoroughly to remove the shampoo.  4. Use CHG as you would any other liquid soap. You can apply CHG directly to the skin and wash gently with a scrungie or a clean washcloth.   5. Apply the CHG Soap to your body ONLY FROM THE NECK DOWN.  Do not use on open wounds or open sores. Avoid contact with your eyes, ears, mouth and genitals (private parts). Wash Face and genitals (private parts)  with your normal soap.  6. Wash thoroughly, paying special attention to the area where your surgery will be performed.  7. Thoroughly rinse your body with warm water from the neck down.  8. DO NOT shower/wash with your normal soap after using and rinsing off the CHG Soap.  9. Pat yourself dry with a CLEAN TOWEL.  10. Wear CLEAN PAJAMAS to bed the night before surgery, wear comfortable clothes the morning of surgery  11. Place CLEAN SHEETS on your bed the night of your first shower and DO NOT SLEEP WITH PETS.    Day of  Surgery: Do not apply any deodorants/lotions. Please wear clean clothes to the hospital/surgery center.      Please read over the following fact sheets that you were given. Coughing and Deep Breathing, MRSA Information and Surgical Site Infection Prevention

## 2017-01-14 ENCOUNTER — Encounter (HOSPITAL_COMMUNITY)
Admission: RE | Admit: 2017-01-14 | Discharge: 2017-01-14 | Disposition: A | Discharge status: Home / Self Care | Payer: Medicare Other | Source: Ambulatory Visit | Attending: Orthopedic Surgery | Admitting: Orthopedic Surgery

## 2017-01-14 ENCOUNTER — Encounter (HOSPITAL_COMMUNITY): Payer: Self-pay | Admitting: Urology

## 2017-01-14 ENCOUNTER — Other Ambulatory Visit: Payer: Self-pay

## 2017-01-14 DIAGNOSIS — M1712 Unilateral primary osteoarthritis, left knee: Secondary | ICD-10-CM | POA: Insufficient documentation

## 2017-01-14 DIAGNOSIS — I10 Essential (primary) hypertension: Secondary | ICD-10-CM | POA: Insufficient documentation

## 2017-01-14 DIAGNOSIS — Z01818 Encounter for other preprocedural examination: Secondary | ICD-10-CM | POA: Diagnosis present

## 2017-01-14 DIAGNOSIS — I493 Ventricular premature depolarization: Secondary | ICD-10-CM | POA: Insufficient documentation

## 2017-01-14 DIAGNOSIS — I491 Atrial premature depolarization: Secondary | ICD-10-CM | POA: Diagnosis not present

## 2017-01-14 HISTORY — DX: Cardiac arrhythmia, unspecified: I49.9

## 2017-01-14 HISTORY — DX: Unspecified osteoarthritis, unspecified site: M19.90

## 2017-01-14 LAB — CBC WITH DIFFERENTIAL/PLATELET
BASOS ABS: 0 10*3/uL (ref 0.0–0.1)
BASOS PCT: 0 %
Eosinophils Absolute: 0.1 10*3/uL (ref 0.0–0.7)
Eosinophils Relative: 1 %
HEMATOCRIT: 43.8 % (ref 36.0–46.0)
HEMOGLOBIN: 15 g/dL (ref 12.0–15.0)
LYMPHS ABS: 1.6 10*3/uL (ref 0.7–4.0)
LYMPHS PCT: 21 %
MCH: 29.5 pg (ref 26.0–34.0)
MCHC: 34.2 g/dL (ref 30.0–36.0)
MCV: 86.1 fL (ref 78.0–100.0)
MONO ABS: 0.5 10*3/uL (ref 0.1–1.0)
MONOS PCT: 7 %
NEUTROS ABS: 5.7 10*3/uL (ref 1.7–7.7)
Neutrophils Relative %: 71 %
Platelets: 277 10*3/uL (ref 150–400)
RBC: 5.09 MIL/uL (ref 3.87–5.11)
RDW: 13.4 % (ref 11.5–15.5)
WBC: 7.9 10*3/uL (ref 4.0–10.5)

## 2017-01-14 LAB — COMPREHENSIVE METABOLIC PANEL
ALBUMIN: 3.8 g/dL (ref 3.5–5.0)
ALT: 19 U/L (ref 14–54)
AST: 21 U/L (ref 15–41)
Alkaline Phosphatase: 119 U/L (ref 38–126)
Anion gap: 7 (ref 5–15)
BILIRUBIN TOTAL: 1.8 mg/dL — AB (ref 0.3–1.2)
BUN: 21 mg/dL — AB (ref 6–20)
CO2: 29 mmol/L (ref 22–32)
Calcium: 9.7 mg/dL (ref 8.9–10.3)
Chloride: 103 mmol/L (ref 101–111)
Creatinine, Ser: 0.89 mg/dL (ref 0.44–1.00)
GFR calc Af Amer: >60 mL/min (ref >60)
GFR calc non Af Amer: >60 mL/min (ref >60)
GLUCOSE: 175 mg/dL — AB (ref 65–99)
POTASSIUM: 3.1 mmol/L — AB (ref 3.5–5.1)
SODIUM: 139 mmol/L (ref 135–145)
TOTAL PROTEIN: 7.7 g/dL (ref 6.5–8.1)

## 2017-01-14 LAB — SURGICAL PCR SCREEN
MRSA, PCR: NEGATIVE
Staphylococcus aureus: NEGATIVE

## 2017-01-14 NOTE — Progress Notes (Addendum)
PCP - Hal Stoneking Pt denies any cardiac issues and denies having cardiologist or cardiac workup.   EKG - 01/14/2017   Patient denies shortness of breath, fever, cough and chest pain at PAT appointment  Patient verbalized understanding of instructions that were given to them at the PAT appointment. Patient was also instructed that they will need to review over the PAT instructions again at home before surgery.   Pt drank coffee prior to PAT appointment and temp 101, pt left prior to re-check of temp. Pt with no complaints of fever or feeling sick. Pt states she will come by tomorrow to check temperature, does not have thermometer at home. Angela with anesthesia notified.

## 2017-01-15 NOTE — Progress Notes (Signed)
Anesthesia Chart Review:  Pt is a 76 year old female scheduled for L unicompartmental knee on 01/19/2017 with Vickey Huger, MD  - PCP is Lajean Manes, MD who cleared pt for surgery at last office visit 12/03/16  PMH includes:  HTN, irregular heart beat (EKG at PCP's office shows PVCs; EKG at pre-admission testing shows PVCs/PACs), endometrial cancer. Never smoker. BMI 35  Medical patients include: Metoprolol, potassium, Dyazide  VS: BP (!) 144/88   Pulse 100   Resp 20   Ht 5' (1.524 m)   Wt 179 lb 1.6 oz (81.2 kg)   SpO2 94%   BMI 34.98 kg/m   - Temp was 101 at pre-admission testing 01/14/17, but pt had drank coffee within a few minutes of that measure. Pt left before having temp rechecked, but returns today and temp is 97.7.  Pt denies any sx of acute illness.   Preoperative labs reviewed.    EKG 01/14/17: Sinus rhythm with occasional PVCs and PACs. Moderate voltage criteria for LVH, may be normal variant. Nonspecific ST abnormality  If no changes, I anticipate pt can proceed with surgery as scheduled.   Willeen Cass, FNP-BC [facility] Short Stay Surgical Center/Anesthesiology Phone: [phone] 01/15/2017 3:27 PM

## 2017-01-16 MED ORDER — BUPIVACAINE LIPOSOME 1.3 % IJ SUSP
20.0000 mL | INTRAMUSCULAR | Status: AC
  Administered 2017-01-19: 20 mL
  Filled 2017-01-16: qty 20

## 2017-01-16 MED ORDER — TRANEXAMIC ACID 1000 MG/10ML IV SOLN
1000.0000 mg | INTRAVENOUS | Status: AC
  Administered 2017-01-19: 1000 mg via INTRAVENOUS
  Filled 2017-01-16: qty 1100

## 2017-01-19 ENCOUNTER — Ambulatory Visit (HOSPITAL_COMMUNITY): Payer: Medicare Other | Admitting: Emergency Medicine

## 2017-01-19 ENCOUNTER — Ambulatory Visit (HOSPITAL_COMMUNITY)
Admission: RE | Admit: 2017-01-19 | Discharge: 2017-01-20 | Disposition: A | Discharge status: Home / Self Care | Payer: Medicare Other | Source: Ambulatory Visit | Attending: Orthopedic Surgery | Admitting: Orthopedic Surgery

## 2017-01-19 ENCOUNTER — Ambulatory Visit (HOSPITAL_COMMUNITY): Payer: Medicare Other | Admitting: Anesthesiology

## 2017-01-19 ENCOUNTER — Encounter (HOSPITAL_COMMUNITY): Payer: Self-pay

## 2017-01-19 ENCOUNTER — Encounter (HOSPITAL_COMMUNITY)
Admission: RE | Disposition: A | Discharge status: Home / Self Care | Payer: Self-pay | Source: Ambulatory Visit | Attending: Orthopedic Surgery

## 2017-01-19 DIAGNOSIS — I10 Essential (primary) hypertension: Secondary | ICD-10-CM | POA: Insufficient documentation

## 2017-01-19 DIAGNOSIS — M1712 Unilateral primary osteoarthritis, left knee: Secondary | ICD-10-CM | POA: Insufficient documentation

## 2017-01-19 DIAGNOSIS — Z79899 Other long term (current) drug therapy: Secondary | ICD-10-CM | POA: Insufficient documentation

## 2017-01-19 DIAGNOSIS — Z96652 Presence of left artificial knee joint: Secondary | ICD-10-CM | POA: Diagnosis present

## 2017-01-19 DIAGNOSIS — C541 Malignant neoplasm of endometrium: Secondary | ICD-10-CM | POA: Diagnosis not present

## 2017-01-19 DIAGNOSIS — Z8542 Personal history of malignant neoplasm of other parts of uterus: Secondary | ICD-10-CM | POA: Insufficient documentation

## 2017-01-19 HISTORY — PX: PARTIAL KNEE ARTHROPLASTY: SHX2174

## 2017-01-19 SURGERY — ARTHROPLASTY, KNEE, UNICOMPARTMENTAL
Anesthesia: Spinal | Laterality: Left

## 2017-01-19 MED ORDER — BUPIVACAINE IN DEXTROSE 0.75-8.25 % IT SOLN
INTRATHECAL | Status: DC | PRN
  Administered 2017-01-19: 1.5 mL via INTRATHECAL

## 2017-01-19 MED ORDER — OXYCODONE HCL 5 MG PO TABS: 5.0000 mg | ORAL_TABLET | ORAL | 0 refills | Status: DC | PRN

## 2017-01-19 MED ORDER — SODIUM CHLORIDE 0.9 % IV SOLN: INTRAVENOUS | Status: DC

## 2017-01-19 MED ORDER — METHOCARBAMOL 500 MG PO TABS
ORAL_TABLET | ORAL | Status: AC
  Administered 2017-01-19: 500 mg via ORAL
  Filled 2017-01-19: qty 1

## 2017-01-19 MED ORDER — DEXAMETHASONE SODIUM PHOSPHATE 10 MG/ML IJ SOLN
8.0000 mg | Freq: Once | INTRAMUSCULAR | Status: AC
  Administered 2017-01-19: 10 mg via INTRAVENOUS

## 2017-01-19 MED ORDER — ONDANSETRON HCL 4 MG/2ML IJ SOLN
INTRAMUSCULAR | Status: AC
  Filled 2017-01-19: qty 2

## 2017-01-19 MED ORDER — ASPIRIN EC 325 MG PO TBEC
325.0000 mg | DELAYED_RELEASE_TABLET | Freq: Two times a day (BID) | ORAL | Status: DC
  Filled 2017-01-19: qty 1

## 2017-01-19 MED ORDER — FENTANYL CITRATE (PF) 100 MCG/2ML IJ SOLN: 25.0000 ug | INTRAMUSCULAR | Status: DC | PRN

## 2017-01-19 MED ORDER — PHENYLEPHRINE HCL 10 MG/ML IJ SOLN
INTRAVENOUS | Status: DC | PRN
  Administered 2017-01-19: 30 ug/min via INTRAVENOUS

## 2017-01-19 MED ORDER — SODIUM CHLORIDE 0.9 % IJ SOLN
INTRAMUSCULAR | Status: DC | PRN
  Administered 2017-01-19: 20 mL

## 2017-01-19 MED ORDER — PROPOFOL 10 MG/ML IV BOLUS
INTRAVENOUS | Status: DC | PRN
  Administered 2017-01-19: 20 mg via INTRAVENOUS

## 2017-01-19 MED ORDER — GABAPENTIN 300 MG PO CAPS
ORAL_CAPSULE | ORAL | Status: AC
  Administered 2017-01-19: 300 mg via ORAL
  Filled 2017-01-19: qty 1

## 2017-01-19 MED ORDER — CHLORHEXIDINE GLUCONATE 4 % EX LIQD: 60.0000 mL | Freq: Once | CUTANEOUS | Status: DC

## 2017-01-19 MED ORDER — METHOCARBAMOL 750 MG PO TABS: 750.0000 mg | ORAL_TABLET | Freq: Four times a day (QID) | ORAL | 0 refills | Status: DC

## 2017-01-19 MED ORDER — LACTATED RINGERS IV SOLN
INTRAVENOUS | Status: DC | PRN
  Administered 2017-01-19 (×2): via INTRAVENOUS

## 2017-01-19 MED ORDER — OXYCODONE HCL 5 MG PO TABS
5.0000 mg | ORAL_TABLET | ORAL | Status: DC | PRN
  Administered 2017-01-19 – 2017-01-20 (×3): 5 mg via ORAL
  Filled 2017-01-19 (×3): qty 1

## 2017-01-19 MED ORDER — 0.9 % SODIUM CHLORIDE (POUR BTL) OPTIME
TOPICAL | Status: DC | PRN
  Administered 2017-01-19: 1000 mL

## 2017-01-19 MED ORDER — DEXAMETHASONE SODIUM PHOSPHATE 4 MG/ML IJ SOLN
INTRAMUSCULAR | Status: AC
  Filled 2017-01-19: qty 2

## 2017-01-19 MED ORDER — CEFAZOLIN SODIUM-DEXTROSE 2-4 GM/100ML-% IV SOLN
2.0000 g | INTRAVENOUS | Status: AC
  Administered 2017-01-19: 2 g via INTRAVENOUS

## 2017-01-19 MED ORDER — PROPOFOL 500 MG/50ML IV EMUL
INTRAVENOUS | Status: DC | PRN
  Administered 2017-01-19: 50 ug/kg/min via INTRAVENOUS

## 2017-01-19 MED ORDER — ONDANSETRON HCL 4 MG/2ML IJ SOLN
INTRAMUSCULAR | Status: DC | PRN
  Administered 2017-01-19: 4 mg via INTRAVENOUS

## 2017-01-19 MED ORDER — BUPIVACAINE-EPINEPHRINE (PF) 0.25% -1:200000 IJ SOLN
INTRAMUSCULAR | Status: AC
  Filled 2017-01-19: qty 30

## 2017-01-19 MED ORDER — METHOCARBAMOL 500 MG PO TABS
500.0000 mg | ORAL_TABLET | Freq: Four times a day (QID) | ORAL | Status: DC
  Administered 2017-01-19 (×2): 500 mg via ORAL
  Filled 2017-01-19: qty 1

## 2017-01-19 MED ORDER — BUPIVACAINE-EPINEPHRINE (PF) 0.25% -1:200000 IJ SOLN
INTRAMUSCULAR | Status: DC | PRN
  Administered 2017-01-19: 30 mL via PERINEURAL

## 2017-01-19 MED ORDER — MIDAZOLAM HCL 2 MG/2ML IJ SOLN
2.0000 mg | Freq: Once | INTRAMUSCULAR | Status: AC
  Administered 2017-01-19: 2 mg via INTRAVENOUS
  Filled 2017-01-19: qty 2

## 2017-01-19 MED ORDER — ACETAMINOPHEN 500 MG PO TABS
1000.0000 mg | ORAL_TABLET | Freq: Once | ORAL | Status: AC
  Administered 2017-01-19: 1000 mg via ORAL

## 2017-01-19 MED ORDER — MEPERIDINE HCL 25 MG/ML IJ SOLN: 6.2500 mg | INTRAMUSCULAR | Status: DC | PRN

## 2017-01-19 MED ORDER — FENTANYL CITRATE (PF) 250 MCG/5ML IJ SOLN
INTRAMUSCULAR | Status: AC
  Filled 2017-01-19: qty 5

## 2017-01-19 MED ORDER — CEFAZOLIN SODIUM-DEXTROSE 2-4 GM/100ML-% IV SOLN
INTRAVENOUS | Status: AC
  Filled 2017-01-19: qty 100

## 2017-01-19 MED ORDER — GABAPENTIN 300 MG PO CAPS
300.0000 mg | ORAL_CAPSULE | Freq: Once | ORAL | Status: AC
  Administered 2017-01-19: 300 mg via ORAL

## 2017-01-19 MED ORDER — FENTANYL CITRATE (PF) 250 MCG/5ML IJ SOLN
INTRAMUSCULAR | Status: DC | PRN
  Administered 2017-01-19: 50 ug via INTRAVENOUS

## 2017-01-19 MED ORDER — ACETAMINOPHEN 500 MG PO TABS
ORAL_TABLET | ORAL | Status: AC
  Administered 2017-01-19: 1000 mg via ORAL
  Filled 2017-01-19: qty 2

## 2017-01-19 MED ORDER — FENTANYL CITRATE (PF) 100 MCG/2ML IJ SOLN
100.0000 ug | Freq: Once | INTRAMUSCULAR | Status: AC
  Administered 2017-01-19: 100 ug via INTRAVENOUS
  Filled 2017-01-19: qty 2

## 2017-01-19 MED ORDER — ROPIVACAINE HCL 7.5 MG/ML IJ SOLN
INTRAMUSCULAR | Status: DC | PRN
  Administered 2017-01-19: 20 mL via PERINEURAL

## 2017-01-19 MED ORDER — ASPIRIN 325 MG PO TABS: 325.0000 mg | ORAL_TABLET | Freq: Two times a day (BID) | ORAL | 0 refills | Status: DC

## 2017-01-19 SURGICAL SUPPLY — 60 items
BANDAGE ELASTIC 6 VELCRO ST LF (GAUZE/BANDAGES/DRESSINGS) ×3 IMPLANT
BANDAGE ESMARK 6X9 LF (GAUZE/BANDAGES/DRESSINGS) ×1 IMPLANT
BEARING TIBIAL IBALANCE SZ1 8 (Joint) ×3 IMPLANT
BLADE SAW RECIP 87.9 MT (BLADE) IMPLANT
BLADE SAW SGTL 13X75X1.27 (BLADE) IMPLANT
BLADE SAW SGTL 83.5X18.5 (BLADE) IMPLANT
BNDG ELASTIC 6X10 VLCR STRL LF (GAUZE/BANDAGES/DRESSINGS) ×3 IMPLANT
BNDG ESMARK 6X9 LF (GAUZE/BANDAGES/DRESSINGS) ×3
BOWL SMART MIX CTS (DISPOSABLE) ×3 IMPLANT
CEMENT BONE SIMPLEX SPEEDSET (Cement) ×3 IMPLANT
CLOSURE STERI-STRIP 1/4X4 (GAUZE/BANDAGES/DRESSINGS) ×3 IMPLANT
COMP FEMUR SZ2 IBAL LT MED (Knees) ×3 IMPLANT
COMPONENT FEMUR SZ2 IBALLT MED (Knees) ×1 IMPLANT
COVER SURGICAL LIGHT HANDLE (MISCELLANEOUS) ×6 IMPLANT
CUFF TOURNIQUET SINGLE 34IN LL (TOURNIQUET CUFF) ×3 IMPLANT
DRAPE EXTREMITY T 121X128X90 (DRAPE) ×3 IMPLANT
DRAPE HALF SHEET 40X57 (DRAPES) ×3 IMPLANT
DRAPE IMP U-DRAPE 54X76 (DRAPES) ×3 IMPLANT
DRAPE INCISE IOBAN 66X45 STRL (DRAPES) ×6 IMPLANT
DRAPE U-SHAPE 47X51 STRL (DRAPES) ×3 IMPLANT
DRSG AQUACEL AG ADV 3.5X10 (GAUZE/BANDAGES/DRESSINGS) ×3 IMPLANT
DURAPREP 26ML APPLICATOR (WOUND CARE) ×6 IMPLANT
ELECT REM PT RETURN 9FT ADLT (ELECTROSURGICAL) ×3
ELECTRODE REM PT RTRN 9FT ADLT (ELECTROSURGICAL) ×1 IMPLANT
FLUID NSS /IRRIG 3000 ML XXX (IV SOLUTION) ×3 IMPLANT
GLOVE BIOGEL M 7.0 STRL (GLOVE) IMPLANT
GLOVE BIOGEL PI IND STRL 7.5 (GLOVE) IMPLANT
GLOVE BIOGEL PI IND STRL 8.5 (GLOVE) ×4 IMPLANT
GLOVE BIOGEL PI INDICATOR 7.5 (GLOVE)
GLOVE BIOGEL PI INDICATOR 8.5 (GLOVE) ×8
GLOVE SURG ORTHO 8.0 STRL STRW (GLOVE) ×12 IMPLANT
GOWN STRL REUS W/ TWL LRG LVL3 (GOWN DISPOSABLE) ×1 IMPLANT
GOWN STRL REUS W/ TWL XL LVL3 (GOWN DISPOSABLE) ×2 IMPLANT
GOWN STRL REUS W/TWL 2XL LVL3 (GOWN DISPOSABLE) ×3 IMPLANT
GOWN STRL REUS W/TWL LRG LVL3 (GOWN DISPOSABLE) ×2
GOWN STRL REUS W/TWL XL LVL3 (GOWN DISPOSABLE) ×4
HANDPIECE INTERPULSE COAX TIP (DISPOSABLE) ×2
HOOD PEEL AWAY FACE SHEILD DIS (HOOD) ×9 IMPLANT
KIT BASIN OR (CUSTOM PROCEDURE TRAY) ×3 IMPLANT
KIT ROOM TURNOVER OR (KITS) ×3 IMPLANT
MANIFOLD NEPTUNE II (INSTRUMENTS) ×3 IMPLANT
NEEDLE 21X1 OR PACK (NEEDLE) IMPLANT
NEEDLE HYPO 21X1 ECLIPSE (NEEDLE) ×6 IMPLANT
NS IRRIG 1000ML POUR BTL (IV SOLUTION) ×3 IMPLANT
PACK BLADE SAW RECIP 70 3 PT (BLADE) ×3 IMPLANT
PACK TOTAL JOINT (CUSTOM PROCEDURE TRAY) ×3 IMPLANT
PAD ARMBOARD 7.5X6 YLW CONV (MISCELLANEOUS) ×6 IMPLANT
SET HNDPC FAN SPRY TIP SCT (DISPOSABLE) ×1 IMPLANT
SUCTION FRAZIER HANDLE 10FR (MISCELLANEOUS) ×2
SUCTION TUBE FRAZIER 10FR DISP (MISCELLANEOUS) ×1 IMPLANT
SUT MNCRL AB 3-0 PS2 18 (SUTURE) ×3 IMPLANT
SUT VIC AB 0 CT1 27 (SUTURE) ×2
SUT VIC AB 0 CT1 27XBRD ANBCTR (SUTURE) ×1 IMPLANT
SUT VIC AB 1 CT1 27 (SUTURE) ×2
SUT VIC AB 1 CT1 27XBRD ANBCTR (SUTURE) ×1 IMPLANT
SUT VIC AB 2-0 CT1 27 (SUTURE) ×2
SUT VIC AB 2-0 CT1 TAPERPNT 27 (SUTURE) ×1 IMPLANT
SYR 20CC LL (SYRINGE) ×6 IMPLANT
TRAY TIB CEMENT IBALANCE SZ1 (Knees) ×3 IMPLANT
WRAP KNEE MAXI GEL POST OP (GAUZE/BANDAGES/DRESSINGS) ×3 IMPLANT

## 2017-01-19 NOTE — H&P (Signed)
Joanna Hays MRN:  [id number] DOB/SEX:  1940-12-06/female  CHIEF COMPLAINT:  Painful left Knee  HISTORY: Patient is a 76 y.o. female presented with a history of pain in the left knee. Onset of symptoms was gradual starting a few years ago with gradually worsening course since that time. Patient has been treated conservatively with over-the-counter NSAIDs and activity modification. Patient currently rates pain in the knee at 10 out of 10 with activity. There is pain at night.  PAST MEDICAL HISTORY: Patient Active Problem List   Diagnosis Date Noted  . Endometrial ca [facility]) 07/17/2011   Past Medical History:  Diagnosis Date  . Arthritis   . Dysrhythmia    hx of irregular heart beat when dehydrated per patient, saw Dr. Felipa Eth for this in last 5 years per patient  . 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 Hypertension    Past Surgical History:  Procedure Laterality Date  . ABDOMINAL HYSTERECTOMY  07/2010   Robotic lap hyst, BSO, bil pelvic and peri-aortic LND  . BREAST BIOPSY     left   . CHOLECYSTECTOMY  1996  . DILATION AND CURETTAGE OF UTERUS    . TONSILLECTOMY AND ADENOIDECTOMY  age 28     MEDICATIONS:   Medications Prior to Admission  Medication Sig Dispense Refill Last Dose  . metoprolol succinate (TOPROL-XL) 25 MG 24 hr tablet Take 25 mg by mouth daily.   01/19/2017 at 0630  . naproxen sodium (ANAPROX) 220 MG tablet Take 440 mg by mouth daily as needed (pain).    Past Month at Unknown time  . potassium chloride (K-DUR,KLOR-CON) 10 MEQ tablet Take 10 mEq by mouth every 30 (thirty) days. When she takes it   01/18/2017 at Unknown time  . triamterene-hydrochlorothiazide (DYAZIDE) 37.5-25 MG capsule Take 1 capsule by mouth daily.   01/18/2017 at Unknown time    ALLERGIES:   Allergies  Allergen Reactions  . Sulfa Antibiotics Other (See Comments)    UNSPECIFIED REACTION     REVIEW OF SYSTEMS:  A comprehensive review of systems was negative except for:  Musculoskeletal: positive for arthralgias and bone pain   FAMILY HISTORY:   Family History  Problem Relation Age of Onset  . Brain cancer Maternal Uncle   . Breast cancer Cousin     SOCIAL HISTORY:   Social History   Tobacco Use  . Smoking status: Never Smoker  . Smokeless tobacco: Never Used  Substance Use Topics  . Alcohol use: Yes    Comment: once a month wine with dinner     EXAMINATION:  Vital signs in last 24 hours: Temp:  [97.9 F (36.6 C)] 97.9 F (36.6 C) (12/03 0830) Pulse Rate:  [95] 95 (12/03 0830) Resp:  [20] 20 (12/03 0830) BP: (158)/(96) 158/96 (12/03 0830) SpO2:  [95 %] 95 % (12/03 0830) Weight:  [81.2 kg (179 lb 1.6 oz)] 81.2 kg (179 lb 1.6 oz) (12/03 0828)  BP (!) 158/96   Pulse 95   Temp 97.9 F (36.6 C) (Oral)   Resp 20   Ht 5' (1.524 m)   Wt 81.2 kg (179 lb 1.6 oz)   SpO2 95%   BMI 34.98 kg/m   General Appearance:    Alert, cooperative, no distress, appears stated age  Head:    Normocephalic, without obvious abnormality, atraumatic  Eyes:    PERRL, conjunctiva/corneas clear, EOM's intact, fundi    benign, both eyes  Ears:    Normal TM's and external ear  canals, both ears  Nose:   Nares normal, septum midline, mucosa normal, no drainage    or sinus tenderness  Throat:   Lips, mucosa, and tongue normal; teeth and gums normal  Neck:   Supple, symmetrical, trachea midline, no adenopathy;    thyroid:  no enlargement/tenderness/nodules; no carotid   bruit or JVD  Back:     Symmetric, no curvature, ROM normal, no CVA tenderness  Lungs:     Clear to auscultation bilaterally, respirations unlabored  Chest Wall:    No tenderness or deformity   Heart:    Regular rate and rhythm, S1 and S2 normal, no murmur, rub   or gallop  Breast Exam:    No tenderness, masses, or nipple abnormality  Abdomen:     Soft, non-tender, bowel sounds active all four quadrants,    no masses, no organomegaly  Genitalia:    Normal female without lesion, discharge or  tenderness  Rectal:    Normal tone, no masses or tenderness;   guaiac negative stool  Extremities:   Extremities normal, atraumatic, no cyanosis or edema  Pulses:   2+ and symmetric all extremities  Skin:   Skin color, texture, turgor normal, no rashes or lesions  Lymph nodes:   Cervical, supraclavicular, and axillary nodes normal  Neurologic:   CNII-XII intact, normal strength, sensation and reflexes    throughout    Musculoskeletal:  ROM 0-120, Ligaments intact,  Imaging Review Plain radiographs demonstrate severe degenerative joint disease of the left knee medial compartment. The overall alignment is mild varus. The bone quality appears to be good for age and reported activity level.  Assessment/Plan: Primary osteoarthritis, left knee   The patient history, physical examination and imaging studies are consistent with advanced degenerative joint disease of the left knee. The patient has failed conservative treatment.  The clearance notes were reviewed.  After discussion with the patient it was felt that partial Knee Replacement was indicated. The procedure,  risks, and benefits of partial knee arthroplasty were presented and reviewed. The risks including but not limited to aseptic loosening, infection, blood clots, vascular injury, stiffness, patella tracking problems complications among others were discussed. The patient acknowledged the explanation, agreed to proceed with the plan.  Donia Ast 01/19/2017, 9:28 AM

## 2017-01-19 NOTE — Progress Notes (Signed)
Pt sitting up on edge of bed at 1845- states she feels dizzy & does not think she will be able to ambulate at home tonight- pt able to take a few steps towards chair - not able to void at this time- pt's husband has been in & out of PACU to see pt - has gone home ^& will wait for a call to pick pt up

## 2017-01-19 NOTE — Addendum Note (Signed)
Addendum  created 01/19/17 1322 by Freddie Breech, CRNA   Intraprocedure Meds edited

## 2017-01-19 NOTE — Transfer of Care (Signed)
Immediate Anesthesia Transfer of Care Note  Patient: Joanna Hays  Procedure(s) Performed: UNICOMPARTMENTAL KNEE (Left )  Patient Location: PACU  Anesthesia Type:Spinal  Level of Consciousness: drowsy and patient cooperative  Airway & Oxygen Therapy: Patient Spontanous Breathing and Patient connected to face mask oxygen  Post-op Assessment: Report given to RN and Post -op Vital signs reviewed and stable  Post vital signs: Reviewed and stable  Last Vitals:  Vitals:   01/19/17 0830  BP: (!) 158/96  Pulse: 95  Resp: 20  Temp: 36.6 C  SpO2: 95%    Last Pain:  Vitals:   01/19/17 0830  TempSrc: Oral      Patients Stated Pain Goal: 4 (55/21/74 7159)  Complications: No apparent anesthesia complications

## 2017-01-19 NOTE — Progress Notes (Signed)
VS changed to q 30 mins

## 2017-01-19 NOTE — Anesthesia Preprocedure Evaluation (Addendum)
Anesthesia Evaluation  Patient identified by MRN, date of birth, ID band Patient awake    Reviewed: Allergy & Precautions, NPO status , Patient's Chart, lab work & pertinent test results  Airway Mallampati: II  TM Distance: >3 FB Neck ROM: Full    Dental no notable dental hx.    Pulmonary neg pulmonary ROS,    Pulmonary exam normal breath sounds clear to auscultation       Cardiovascular hypertension, Pt. on medications and Pt. on home beta blockers negative cardio ROS Normal cardiovascular exam Rhythm:Regular Rate:Normal     Neuro/Psych negative neurological ROS  negative psych ROS   GI/Hepatic negative GI ROS, Neg liver ROS,   Endo/Other  negative endocrine ROS  Renal/GU negative Renal ROS  negative genitourinary   Musculoskeletal negative musculoskeletal ROS (+)   Abdominal   Peds negative pediatric ROS (+)  Hematology negative hematology ROS (+)   Anesthesia Other Findings   Reproductive/Obstetrics negative OB ROS                             Anesthesia Physical Anesthesia Plan  ASA: II  Anesthesia Plan: Spinal   Post-op Pain Management:  Regional for Post-op pain   Induction:   PONV Risk Score and Plan: 2 and Treatment may vary due to age or medical condition, Ondansetron and Dexamethasone  Airway Management Planned: Nasal Cannula and Natural Airway  Additional Equipment:   Intra-op Plan:   Post-operative Plan:   Informed Consent: I have reviewed the patients History and Physical, chart, labs and discussed the procedure including the risks, benefits and alternatives for the proposed anesthesia with the patient or authorized representative who has indicated his/her understanding and acceptance.     Plan Discussed with: CRNA and Anesthesiologist  Anesthesia Plan Comments: (  )       Anesthesia Quick Evaluation

## 2017-01-19 NOTE — Op Note (Addendum)
UNI KNEE REPLACEMENT OPERATIVE NOTE:  01/19/2017  2:36 PM  PATIENT:  Joanna Hays  76 y.o. female  PRE-OPERATIVE DIAGNOSIS:  primary osteoarthritis left knee  POST-OPERATIVE DIAGNOSIS:  primary osteoarthritis left knee  PROCEDURE:  Procedure(s): UNICOMPARTMENTAL KNEE  SURGEON:  Surgeon(s): Vickey Huger, MD  PHYSICIAN ASSISTANT:Blair Mancel Bale,  [facility]  ANESTHESIA:   spinal  DRAINS: Hemovac  SPECIMEN: None  COUNTS:  Correct  TOURNIQUET:   Total Tourniquet Time Documented: Thigh (Left) - 50 minutes Total: Thigh (Left) - 50 minutes   DICTATION:  Indication for procedure:    The patient is a 76 y.o. female who has failed conservative treatment for primary osteoarthritis left knee.  Informed consent was obtained prior to anesthesia. The risks versus benefits of the operation were explain and in a way the patient can, and did, understand.   On the implant demand matching protocol, this patient scored 10.  Therefore, this patient was receive a polyethylene insert with vitamin E which is a high demand implant.  Description of procedure:     The patient was taken to the operating room and placed under anesthesia.  The patient was positioned in the usual fashion taking care that all body parts were adequately padded and/or protected.  I foley catheter was not placed.  A tourniquet was applied and the leg prepped and draped in the usual sterile fashion.  The extremity was exsanguinated with the esmarch and tourniquet inflated to 350 mmHg.  Pre-operative range of motion was normal.  The knee was in 5 degree of mild varus.  A midline incision approximately 3-4 inches long was made with a #10 blade.  A new blade was used to make a parapatellar arthrotomy going 1 cm into the quadriceps tendon, over the patella, and alongside the medial aspect of the patellar tendon.  A synovectomy was then performed with the #10 blade and forceps. I then elevated the deep MCL off the medial tibial flare.  The knee was put at 90 degrees and the patient specific cutting blocks were used to make our proximal tibial cut and distal femoral cut. The medial meniscus was removed at this point.  I then used the size 2 cutting guide on the femur to drill for lugs and cut the chamfers. Likewise, a 2 tibial baseplate was used to prepare the tibia. I then trialed the  2 femur and 1 tibia. I trialed several poly inserts and a 9 mm achieved good balance in flexion and extension.  I then irrigated copiously and then mixed the cement. I injected exparel in the deep soft tissues at this point. I then cemented the tibia first followed by the femur and removed excess cement and then inserted the polyethylene. I placed the leg in extension and finished injecting the rest of the exparel.  BLOOD LOSS:  992EQ  COMPLICATIONS:  None.  PLAN OF CARE: Discharge to home after PACU  PATIENT DISPOSITION:  PACU - hemodynamically stable.   Delay start of Pharmacological VTE agent (>24hrs) due to surgical blood loss or risk of bleeding:  not applicable  Please fax a copy of this op note to my office at [phone] (please only include page 1 and 2 of the Case Information op note)

## 2017-01-19 NOTE — Anesthesia Procedure Notes (Signed)
Spinal  Patient location during procedure: OR Start time: 01/19/2017 11:23 AM End time: 01/19/2017 11:27 AM Staffing Anesthesiologist: Janeece Riggers, MD Preanesthetic Checklist Completed: patient identified, site marked, surgical consent, pre-op evaluation, timeout performed, IV checked, risks and benefits discussed and monitors and equipment checked Spinal Block Patient position: sitting Prep: DuraPrep Patient monitoring: heart rate, cardiac monitor, continuous pulse ox and blood pressure Approach: midline Location: L3-4 Injection technique: single-shot Needle Needle type: Sprotte  Needle gauge: 24 G Needle length: 9 cm Assessment Sensory level: T4 Additional Notes Multiple attempts at same level osso.  Reposition   + csf

## 2017-01-19 NOTE — Anesthesia Postprocedure Evaluation (Signed)
Anesthesia Post Note  Patient: Joanna Hays  Procedure(s) Performed: UNICOMPARTMENTAL KNEE (Left )     Patient location during evaluation: PACU Anesthesia Type: Spinal Level of consciousness: oriented and awake and alert Pain management: pain level controlled Vital Signs Assessment: post-procedure vital signs reviewed and stable Respiratory status: spontaneous breathing, respiratory function stable and patient connected to nasal cannula oxygen Cardiovascular status: blood pressure returned to baseline and stable Postop Assessment: no headache, no backache and no apparent nausea or vomiting Anesthetic complications: no    Last Vitals:  Vitals:   01/19/17 1306 01/19/17 1307  BP: 96/65   Pulse: 70   Resp: 12   Temp: (!) 36.1 C (!) 36.1 C  SpO2: 100%     Last Pain:  Vitals:   01/19/17 1307  TempSrc: Skin                 Kailen Hinkle

## 2017-01-19 NOTE — Anesthesia Procedure Notes (Addendum)
Anesthesia Regional Block: Adductor canal block   Pre-Anesthetic Checklist: ,, timeout performed, Correct Patient, Correct Site, Correct Laterality, Correct Procedure, Correct Position, site marked, Risks and benefits discussed,  Surgical consent,  Pre-op evaluation,  At surgeon's request and post-op pain management  Laterality: Left  Prep: chloraprep       Needles:  Injection technique: Single-shot  Needle Type: Echogenic Stimulator Needle     Needle Length: 5cm  Needle Gauge: 22     Additional Needles:   Procedures:, nerve stimulator,,, ultrasound used (permanent image in chart),,,,  Narrative:  Start time: 01/19/2017 9:40 AM End time: 01/19/2017 9:45 AM Injection made incrementally with aspirations every 5 mL.  Performed by: Personally  Anesthesiologist: Janeece Riggers, MD  Additional Notes: Functioning IV was confirmed and monitors were applied.  A 66mm 22ga Arrow echogenic stimulator needle was used. Sterile prep and drape,hand hygiene and sterile gloves were used. Ultrasound guidance: relevant anatomy identified, needle position confirmed, local anesthetic spread visualized around nerve(s)., vascular puncture avoided.  Image printed for medical record. Negative aspiration and negative test dose prior to incremental administration of local anesthetic. The patient tolerated the procedure well.

## 2017-01-20 DIAGNOSIS — M1712 Unilateral primary osteoarthritis, left knee: Secondary | ICD-10-CM | POA: Diagnosis not present

## 2017-01-20 NOTE — Plan of Care (Signed)
  Progressing Safety: Ability to remain free from injury will improve 01/20/2017 0505 - Progressing by Jonnie Finner, RN Education: Knowledge of the prescribed therapeutic regimen will improve 01/20/2017 0505 - Progressing by Jonnie Finner, RN Activity: Ability to avoid complications of mobility impairment will improve 01/20/2017 0505 - Progressing by Jonnie Finner, RN Range of joint motion will improve 01/20/2017 0505 - Progressing by Jonnie Finner, RN Clinical Measurements: Postoperative complications will be avoided or minimized 01/20/2017 0505 - Progressing by Jonnie Finner, RN Pain Management: Pain level will decrease with appropriate interventions 01/20/2017 0505 - Progressing by Jonnie Finner, RN Skin Integrity: Signs of wound healing will improve 01/20/2017 0505 - Progressing by Jonnie Finner, RN

## 2017-01-20 NOTE — Progress Notes (Signed)
Patient alert and oriented, mae's well, voiding adequate amount of urine, swallowing without difficulty, no c/o pain at time of discharge. Patient discharged home with family. Script and discharged instructions given to patient. Patient and family stated understanding of instructions given. Patient has an appointment with Dr.Lucey

## 2017-01-21 ENCOUNTER — Encounter (HOSPITAL_COMMUNITY): Payer: Self-pay | Admitting: Orthopedic Surgery

## 2017-02-09 ENCOUNTER — Observation Stay (HOSPITAL_COMMUNITY)
Admission: EM | Admit: 2017-02-09 | Discharge: 2017-02-10 | Disposition: A | Discharge status: Home / Self Care | Payer: Medicare Other | Attending: Internal Medicine | Admitting: Internal Medicine

## 2017-02-09 ENCOUNTER — Emergency Department (HOSPITAL_COMMUNITY): Payer: Medicare Other

## 2017-02-09 ENCOUNTER — Encounter (HOSPITAL_COMMUNITY): Payer: Self-pay | Admitting: Emergency Medicine

## 2017-02-09 DIAGNOSIS — Z803 Family history of malignant neoplasm of breast: Secondary | ICD-10-CM | POA: Diagnosis not present

## 2017-02-09 DIAGNOSIS — Z808 Family history of malignant neoplasm of other organs or systems: Secondary | ICD-10-CM | POA: Insufficient documentation

## 2017-02-09 DIAGNOSIS — Z9889 Other specified postprocedural states: Secondary | ICD-10-CM | POA: Diagnosis not present

## 2017-02-09 DIAGNOSIS — Z9049 Acquired absence of other specified parts of digestive tract: Secondary | ICD-10-CM | POA: Insufficient documentation

## 2017-02-09 DIAGNOSIS — M1990 Unspecified osteoarthritis, unspecified site: Secondary | ICD-10-CM

## 2017-02-09 DIAGNOSIS — Z8543 Personal history of malignant neoplasm of ovary: Secondary | ICD-10-CM | POA: Diagnosis not present

## 2017-02-09 DIAGNOSIS — R531 Weakness: Secondary | ICD-10-CM | POA: Insufficient documentation

## 2017-02-09 DIAGNOSIS — E860 Dehydration: Secondary | ICD-10-CM | POA: Diagnosis not present

## 2017-02-09 DIAGNOSIS — Z882 Allergy status to sulfonamides status: Secondary | ICD-10-CM | POA: Diagnosis not present

## 2017-02-09 DIAGNOSIS — A419 Sepsis, unspecified organism: Secondary | ICD-10-CM

## 2017-02-09 DIAGNOSIS — C541 Malignant neoplasm of endometrium: Secondary | ICD-10-CM | POA: Diagnosis present

## 2017-02-09 DIAGNOSIS — Z79899 Other long term (current) drug therapy: Secondary | ICD-10-CM | POA: Diagnosis not present

## 2017-02-09 DIAGNOSIS — I10 Essential (primary) hypertension: Secondary | ICD-10-CM

## 2017-02-09 DIAGNOSIS — Z96652 Presence of left artificial knee joint: Secondary | ICD-10-CM

## 2017-02-09 DIAGNOSIS — Z90710 Acquired absence of both cervix and uterus: Secondary | ICD-10-CM | POA: Insufficient documentation

## 2017-02-09 DIAGNOSIS — E872 Acidosis: Secondary | ICD-10-CM

## 2017-02-09 DIAGNOSIS — I4581 Long QT syndrome: Secondary | ICD-10-CM | POA: Diagnosis not present

## 2017-02-09 LAB — COMPREHENSIVE METABOLIC PANEL
ALK PHOS: 133 U/L — AB (ref 38–126)
ALT: 22 U/L (ref 14–54)
ANION GAP: 13 (ref 5–15)
AST: 31 U/L (ref 15–41)
Albumin: 3.9 g/dL (ref 3.5–5.0)
BUN: 33 mg/dL — ABNORMAL HIGH (ref 6–20)
CALCIUM: 9.7 mg/dL (ref 8.9–10.3)
CO2: 21 mmol/L — AB (ref 22–32)
CREATININE: 0.91 mg/dL (ref 0.44–1.00)
Chloride: 103 mmol/L (ref 101–111)
GFR, EST NON AFRICAN AMERICAN: 59 mL/min — AB (ref >60)
Glucose, Bld: 176 mg/dL — ABNORMAL HIGH (ref 65–99)
Potassium: 3.6 mmol/L (ref 3.5–5.1)
SODIUM: 137 mmol/L (ref 135–145)
TOTAL PROTEIN: 7.9 g/dL (ref 6.5–8.1)
Total Bilirubin: 1.7 mg/dL — ABNORMAL HIGH (ref 0.3–1.2)

## 2017-02-09 LAB — CBC WITH DIFFERENTIAL/PLATELET
Basophils Absolute: 0 10*3/uL (ref 0.0–0.1)
Basophils Relative: 1 %
EOS ABS: 0.2 10*3/uL (ref 0.0–0.7)
EOS PCT: 2 %
HCT: 44.9 % (ref 36.0–46.0)
HEMOGLOBIN: 15.6 g/dL — AB (ref 12.0–15.0)
LYMPHS ABS: 1.6 10*3/uL (ref 0.7–4.0)
LYMPHS PCT: 19 %
MCH: 29.4 pg (ref 26.0–34.0)
MCHC: 34.7 g/dL (ref 30.0–36.0)
MCV: 84.6 fL (ref 78.0–100.0)
MONOS PCT: 9 %
Monocytes Absolute: 0.7 10*3/uL (ref 0.1–1.0)
NEUTROS PCT: 69 %
Neutro Abs: 5.9 10*3/uL (ref 1.7–7.7)
Platelets: 284 10*3/uL (ref 150–400)
RBC: 5.31 MIL/uL — AB (ref 3.87–5.11)
RDW: 13.3 % (ref 11.5–15.5)
WBC: 8.4 10*3/uL (ref 4.0–10.5)

## 2017-02-09 LAB — URINALYSIS, ROUTINE W REFLEX MICROSCOPIC
Bilirubin Urine: NEGATIVE
GLUCOSE, UA: NEGATIVE mg/dL
HGB URINE DIPSTICK: NEGATIVE
Ketones, ur: NEGATIVE mg/dL
LEUKOCYTES UA: NEGATIVE
Nitrite: NEGATIVE
PH: 5 (ref 5.0–8.0)
Protein, ur: NEGATIVE mg/dL
SPECIFIC GRAVITY, URINE: 1.024 (ref 1.005–1.030)

## 2017-02-09 LAB — LACTIC ACID, PLASMA: Lactic Acid, Venous: 1.1 mmol/L (ref 0.5–1.9)

## 2017-02-09 LAB — I-STAT CG4 LACTIC ACID, ED
LACTIC ACID, VENOUS: 4.45 mmol/L — AB (ref 0.5–1.9)
Lactic Acid, Venous: 1.34 mmol/L (ref 0.5–1.9)

## 2017-02-09 MED ORDER — VANCOMYCIN HCL IN DEXTROSE 1-5 GM/200ML-% IV SOLN
1000.0000 mg | Freq: Once | INTRAVENOUS | Status: AC
  Administered 2017-02-09: 1000 mg via INTRAVENOUS

## 2017-02-09 MED ORDER — HEPARIN SODIUM (PORCINE) 5000 UNIT/ML IJ SOLN
5000.0000 [IU] | Freq: Three times a day (TID) | INTRAMUSCULAR | Status: DC
  Filled 2017-02-09: qty 1

## 2017-02-09 MED ORDER — ACETAMINOPHEN 325 MG PO TABS: 650.0000 mg | ORAL_TABLET | Freq: Four times a day (QID) | ORAL | Status: DC | PRN

## 2017-02-09 MED ORDER — VANCOMYCIN HCL IN DEXTROSE 750-5 MG/150ML-% IV SOLN: 750.0000 mg | Freq: Two times a day (BID) | INTRAVENOUS | Status: DC

## 2017-02-09 MED ORDER — ONDANSETRON HCL 4 MG PO TABS: 4.0000 mg | ORAL_TABLET | Freq: Four times a day (QID) | ORAL | Status: DC | PRN

## 2017-02-09 MED ORDER — TRIAMTERENE-HCTZ 37.5-25 MG PO CAPS
1.0000 | ORAL_CAPSULE | Freq: Every day | ORAL | Status: DC
  Filled 2017-02-09: qty 1

## 2017-02-09 MED ORDER — PIPERACILLIN-TAZOBACTAM 3.375 G IVPB 30 MIN
3.3750 g | Freq: Once | INTRAVENOUS | Status: AC
Start: 2017-02-09 — End: 2017-02-09
  Administered 2017-02-09: 3.375 g via INTRAVENOUS
  Filled 2017-02-09: qty 50

## 2017-02-09 MED ORDER — SODIUM CHLORIDE 0.9 % IV BOLUS (SEPSIS)
1000.0000 mL | Freq: Once | INTRAVENOUS | Status: AC
  Administered 2017-02-09: 1000 mL via INTRAVENOUS

## 2017-02-09 MED ORDER — METOPROLOL SUCCINATE ER 25 MG PO TB24
25.0000 mg | ORAL_TABLET | Freq: Every day | ORAL | Status: DC
  Administered 2017-02-10: 25 mg via ORAL
  Filled 2017-02-09: qty 1

## 2017-02-09 MED ORDER — SODIUM CHLORIDE 0.9 % IV BOLUS (SEPSIS)
500.0000 mL | Freq: Once | INTRAVENOUS | Status: AC
  Administered 2017-02-09: 500 mL via INTRAVENOUS

## 2017-02-09 MED ORDER — HYDROCODONE-ACETAMINOPHEN 5-325 MG PO TABS: 1.0000 | ORAL_TABLET | ORAL | Status: DC | PRN

## 2017-02-09 MED ORDER — ONDANSETRON HCL 4 MG/2ML IJ SOLN: 4.0000 mg | Freq: Four times a day (QID) | INTRAMUSCULAR | Status: DC | PRN

## 2017-02-09 MED ORDER — ACETAMINOPHEN 650 MG RE SUPP: 650.0000 mg | Freq: Four times a day (QID) | RECTAL | Status: DC | PRN

## 2017-02-09 MED ORDER — SENNOSIDES-DOCUSATE SODIUM 8.6-50 MG PO TABS: 1.0000 | ORAL_TABLET | Freq: Every evening | ORAL | Status: DC | PRN

## 2017-02-09 MED ORDER — BISACODYL 10 MG RE SUPP: 10.0000 mg | Freq: Every day | RECTAL | Status: DC | PRN

## 2017-02-09 MED ORDER — PIPERACILLIN-TAZOBACTAM 3.375 G IVPB
3.3750 g | Freq: Three times a day (TID) | INTRAVENOUS | Status: DC
  Filled 2017-02-09: qty 50

## 2017-02-09 MED ORDER — SODIUM CHLORIDE 0.9 % IV SOLN
INTRAVENOUS | Status: DC
  Administered 2017-02-09 – 2017-02-10 (×2): 100 mL/h via INTRAVENOUS

## 2017-02-09 NOTE — ED Notes (Signed)
Declined W/C at D/C and was escorted to lobby by RN. 

## 2017-02-09 NOTE — ED Notes (Signed)
Pt given ice chips per [place], [place]

## 2017-02-09 NOTE — Progress Notes (Signed)
Pharmacy Antibiotic Note  Joanna Hays is a 76 y.o. female admitted on 02/09/2017 with sepsis.  Pharmacy has been consulted for Zosyn and vancomycin dosing.  Given one time doses in the ED. Afebrile, WBC wnl. LA 4.45. SCr stable, CrCl ~23ml/min  Plan: Start Zosyn 3.375 gm IV q8h (4 hour infusion) Start vancomycin 750mg  IV Q12h Monitor clinical picture, renal function, VT prn F/U C&S, abx deescalation / LOT     Temp (24hrs), Avg:98.6 F (37 C), Min:98.6 F (37 C), Max:98.6 F (37 C)  Recent Labs  Lab 02/09/17 1114 02/09/17 1238  WBC 8.4  --   CREATININE 0.91  --   LATICACIDVEN  --  4.45*    CrCl cannot be calculated (Unknown ideal weight.).    Allergies  Allergen Reactions  . Sulfa Antibiotics Other (See Comments)    UNSPECIFIED REACTION     Thank you for allowing pharmacy to be a part of this patient's care.  Joanna Hays 02/09/2017 1:31 PM

## 2017-02-09 NOTE — ED Triage Notes (Addendum)
States had partial  Left knee replacement on Dec 3,  For the last week she has not felt well  incfeasing weakness and her heart rate is elevated 135, states has been urinating fine and bms are fine, states no fever, knee incision looks just slightly red and feels tight but otherwise she is walking d=fine, just wants to be checked out denies cp or sob

## 2017-02-09 NOTE — H&P (Signed)
History and Physical    KADEY MIHALIC Hays:[id number] DOB: [birth year] DOA: 02/09/2017   PCP: Lajean Manes, MD   Patient coming from:  Home    Chief Complaint: Generalized weakness, fatigue  HPI: Joanna Hays is a 76 y.o. 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  She states that although healing well from the left knee, she feels exhausted, not been ambulating much.  She denies any chest pain or palpitations.  No dizziness, vertigo, syncope or presyncope.  She denies any pleuritic chest pain.  She denies any nausea or vomiting.  Denies any abdominal pain, dysuria or gross hematuria.  She denies any melena or hematochezia. Sse denies any significant amount of NSAIDS or appetite suppressant    ED Course:  BP (!) 149/86   Pulse 93   Temp 98.6 F (37 C) (Oral)   Resp (!) 26   SpO2 98%   Lactic acid on presentation was noted to be 4.45, for which she receive generous IV fluid Initially thought to be septic, and vancomycin and Zosyn were ordered. The patient is afebrile, her white count is normal at 8.4.  Hemoglobin is normal at 15.6.  Platelets was 284.  Alkaline phosphatase 133, total bilirubin 0.7 Urine was negative. Blood cultures are pending. Chest x-ray was negative for acute findings Left knee x-ray was negative for any infectious process. She was noted to be tachycardic, but this improved with generous hydration.  Review of Systems:  As per HPI otherwise all other systems reviewed and are negative  Past Medical History:  Diagnosis Date  . Arthritis   . Dysrhythmia    hx of irregular heart beat when dehydrated per  patient, saw Dr. Felipa Eth for this in last 5 years per patient  . 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 Hypertension     Past Surgical History:  Procedure Laterality Date  . ABDOMINAL HYSTERECTOMY  07/2010   Robotic lap hyst, BSO, bil pelvic and peri-aortic LND  . BREAST BIOPSY     left   . CHOLECYSTECTOMY  1996  . DILATION AND CURETTAGE OF UTERUS    . PARTIAL KNEE ARTHROPLASTY Left 01/19/2017   Procedure: UNICOMPARTMENTAL KNEE;  Surgeon: Vickey Huger, MD;  Location: [facility];  Service: Orthopedics;  Laterality: Left;  . TONSILLECTOMY AND ADENOIDECTOMY  age 43    Social History Social History   Socioeconomic History  . Marital status: Married    Spouse name: Not on file  . Number of children: Not on file  . Years of education: Not on file  . Highest education level: Not on file  Social Needs  . Financial resource strain: Not on file  . Food insecurity - worry: Not on file  . Food insecurity - inability: Not on file  . Transportation needs - medical: Not on file  . Transportation needs - non-medical: Not on file  Occupational History  . Not on file  Tobacco Use  . Smoking status: Never Smoker  . Smokeless tobacco: Never Used  Substance and Sexual Activity  . Alcohol use: Yes    Comment: once a month wine with dinner  . Drug use: No  . Sexual activity: Not on  file  Other Topics Concern  . Not on file  Social History Narrative  . Not on file     Allergies  Allergen Reactions  . Sulfa Antibiotics Other (See Comments)    UNSPECIFIED REACTION     Family History  Problem Relation Age of Onset  . Brain cancer Maternal Uncle   . Breast cancer Cousin       Prior to Admission medications   Medication Sig Start Date End Date Taking? Authorizing Provider  metoprolol succinate (TOPROL-XL) 25 MG 24 hr tablet Take 25 mg by mouth daily.   Yes [provider]  triamterene-hydrochlorothiazide (DYAZIDE) 37.5-25 MG capsule Take 1 capsule by mouth  daily.   Yes [provider]  aspirin 325 MG tablet Take 1 tablet (325 mg total) by mouth 2 (two) times daily. Patient not taking: Reported on 02/09/2017 01/19/17   Donia Ast, [place]    Physical Exam:  Vitals:   02/09/17 1500 02/09/17 1515 02/09/17 1530 02/09/17 1545  BP: 133/82 137/70 125/60 (!) 149/86  Pulse: 86 93 87 93  Resp: (!) 21 17 18  (!) 26  Temp:      TempSrc:      SpO2: 97% 99% 98% 98%   Constitutional: NAD, calm, comfortable  Eyes: PERRL, lids and conjunctivae normal ENMT: Mucous membranes are moist, without exudate or lesions  Neck: normal, supple, no masses, no thyromegaly Respiratory: clear to auscultation bilaterally, no wheezing, no crackles. Normal respiratory effort  Cardiovascular: Regular rate and rhythm,  murmur, rubs or gallops. No extremity edema. 2+ pedal pulses. No carotid bruits.  Abdomen: Soft, non tender, No hepatosplenomegaly. Bowel sounds positive.  Musculoskeletal: no clubbing / cyanosis. Moves all extremities. Well healed L knee surgical site  Skin: no jaundice, No lesions.  Neurologic: Sensation intact  Strength equal in all extremities Psychiatric:   Alert and oriented x 3. Normal mood.     Labs on Admission: I have personally reviewed following labs and imaging studies  CBC: Recent Labs  Lab 02/09/17 1114  WBC 8.4  NEUTROABS 5.9  HGB 15.6*  HCT 44.9  MCV 84.6  PLT 703    Basic Metabolic Panel: Recent Labs  Lab 02/09/17 1114  NA 137  K 3.6  CL 103  CO2 21*  GLUCOSE 176*  BUN 33*  CREATININE 0.91  CALCIUM 9.7    GFR: CrCl cannot be calculated (Unknown ideal weight.).  Liver Function Tests: Recent Labs  Lab 02/09/17 1114  AST 31  ALT 22  ALKPHOS 133*  BILITOT 1.7*  PROT 7.9  ALBUMIN 3.9   No results for input(s): LIPASE, AMYLASE in the last 168 hours. No results for input(s): AMMONIA in the last 168 hours.  Coagulation Profile: No results for input(s): INR, PROTIME in the last 168  hours.  Cardiac Enzymes: No results for input(s): CKTOTAL, CKMB, CKMBINDEX, TROPONINI in the last 168 hours.  BNP (last 3 results) No results for input(s): PROBNP in the last 8760 hours.  HbA1C: No results for input(s): HGBA1C in the last 72 hours.  CBG: No results for input(s): GLUCAP in the last 168 hours.  Lipid Profile: No results for input(s): CHOL, HDL, LDLCALC, TRIG, CHOLHDL, LDLDIRECT in the last 72 hours.  Thyroid Function Tests: No results for input(s): TSH, T4TOTAL, FREET4, T3FREE, THYROIDAB in the last 72 hours.  Anemia Panel: No results for input(s): VITAMINB12, FOLATE, FERRITIN, TIBC, IRON, RETICCTPCT in the last 72 hours.  Urine analysis:    Component Value Date/Time   COLORURINE YELLOW  02/09/2017 [place] 02/09/2017 1317   LABSPEC 1.024 02/09/2017 1317   PHURINE 5.0 02/09/2017 1317   GLUCOSEU NEGATIVE 02/09/2017 1317   [place] 02/09/2017 1317   [place] 02/09/2017 1317   KETONESUR NEGATIVE 02/09/2017 1317   PROTEINUR NEGATIVE 02/09/2017 1317   NITRITE NEGATIVE 02/09/2017 1317   LEUKOCYTESUR NEGATIVE 02/09/2017 1317    Sepsis Labs: @LABRCNTIP (procalcitonin:4,lacticidven:4) )No results found for this or any previous visit (from the past 240 hour(s)).   Radiological Exams on Admission: Dg Chest 2 View  Result Date: 02/09/2017 CLINICAL DATA:  Partial knee replacement.  Postop film. EXAM: CHEST  2 VIEW COMPARISON:  July 19, 2010 FINDINGS: The heart size and mediastinal contours are within normal limits. Both lungs are clear. The visualized skeletal structures are unremarkable. IMPRESSION: No active cardiopulmonary disease. Electronically Signed   By: Dorise Bullion III M.D   On: 02/09/2017 11:55   Dg Knee Complete 4 Views Left  Result Date: 02/09/2017 CLINICAL DATA:  Postop film EXAM: LEFT KNEE - COMPLETE 4+ VIEW COMPARISON:  None. FINDINGS: The patient is status post partial left knee replacement with replacement of  the medial compartment. Hardware is in good position with no evidence of failure or loosening. No other acute abnormalities. Mild soft tissue edema. No effusion. IMPRESSION: Partial knee replacement with no evidence of failure. Electronically Signed   By: Dorise Bullion III M.D   On: 02/09/2017 11:56    EKG: Independently reviewed.  Assessment/Plan Active Problems:   Dehydration   Endometrial ca (HCC)   S/P left unicompartmental knee replacement   Hypertension   Arthritis   Generalized weakness in the setting of severe dehydration. Lactic acid on presentation was noted to be 4.45, for which she receive generous IV fluid. Initially thought to be septic, and vancomycin and Zosyn were ordered.  Second lactic acid is pending.  Count is normal at 8.4.  No bleeding issues are noted.  Urine is negative.  Blood cultures are pending.  Noted to have alkaline phosphatase at 133, and bilirubin of 0.7, also believed to be due to dehydration.  Chest x-ray was negative for acute findings.  The left knee x-ray was negative for any infectious process.  The patient was initially tachycardic, but these has significantly improved after hydration, therefore sepsis is not suspected.  Antibiotics IV will be discontinued. Admit to telemetry observation IV fluids at 100 cc an hour Hold diuretics today CMET  in a.m. Follow blood culture    Hypertension BP  149/86   Pulse 93   Continue home Toprol, may resume dyazide if renal function ok and dehydration resolves   Athritis , s/p L knee replacement, no acute issues, patient able to mobilize without difficulty.  She declines any PT or OT.  DVT prophylaxis:  Heparin Code Status:    Full  Family Communication:  Discussed with patient Disposition Plan: Expect patient to be discharged to home after condition improves Consults called:    None  Admission status: Tele Obs    Sharene Butters, PA-C [facility]   02/09/2017, 5:07 PM

## 2017-02-09 NOTE — ED Provider Notes (Signed)
[facility] EMERGENCY DEPARTMENT Provider Note   CSN: [id number] Arrival date & time: 02/09/17  1044     History   Chief Complaint Chief Complaint  Patient presents with  . Weakness    HPI Joanna Hays is a 76 y.o. female who presents to the ED for weakness. She is S/P partial Knee arthroplasty on 01/19/2017. The patient presents for weakness.  She states that she has essentially been in the bed for 2 weeks after her surgery however has been feeling well.  She has noticed this week that she is just been extremely exhausted and at home noticed that her heart rate was elevated to the 130s.  She notes that her normal pulses about 70.  She denies urinary symptoms abdominal pain, severe knee pain, fevers, chills, cough or shortness of breath.  She denies any melena or hematochezia.  HPI  Past Medical History:  Diagnosis Date  . Arthritis   . Dysrhythmia    hx of irregular heart beat when dehydrated per patient, saw Dr. Felipa Eth for this in last 5 years per patient  . 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 Hypertension     Patient Active Problem List   Diagnosis Date Noted  . 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 Endometrial ca [facility]) 07/17/2011    Past Surgical History:  Procedure Laterality Date  . ABDOMINAL HYSTERECTOMY  07/2010   Robotic lap hyst, BSO, bil pelvic and peri-aortic LND  . BREAST BIOPSY     left   . CHOLECYSTECTOMY  1996  . DILATION AND CURETTAGE OF UTERUS    . PARTIAL KNEE ARTHROPLASTY Left 01/19/2017   Procedure: UNICOMPARTMENTAL KNEE;  Surgeon: Vickey Huger, MD;  Location: [facility];  Service: Orthopedics;  Laterality: Left;  . TONSILLECTOMY AND ADENOIDECTOMY  age 50    OB History    Gravida Para Term Preterm AB Living   2 2       2    SAB TAB Ectopic Multiple Live Births                   Home Medications    Prior to Admission medications   Medication Sig Start Date End Date Taking?  Authorizing Provider  metoprolol succinate (TOPROL-XL) 25 MG 24 hr tablet Take 25 mg by mouth daily.   Yes [provider]  triamterene-hydrochlorothiazide (DYAZIDE) 37.5-25 MG capsule Take 1 capsule by mouth daily.   Yes [provider]  aspirin 325 MG tablet Take 1 tablet (325 mg total) by mouth 2 (two) times daily. Patient not taking: Reported on 02/09/2017 01/19/17   Donia Ast, PA    Family History Family History  Problem Relation Age of Onset  . Brain cancer Maternal Uncle   . Breast cancer Cousin     Social History Social History   Tobacco Use  . Smoking status: Never Smoker  . Smokeless tobacco: Never Used  Substance Use Topics  . Alcohol use: Yes    Comment: once a month wine with dinner  . Drug use: No     Allergies   Sulfa antibiotics   Review of Systems Review of Systems  Ten systems reviewed and are negative for acute change, except as noted in the HPI.   Physical Exam Updated Vital Signs BP 137/70   Pulse 93   Temp 98.6 F (37 C) (Oral)   Resp 17   SpO2 99%   Physical Exam  Constitutional: She is  oriented to person, place, and time. She appears well-developed and well-nourished. No distress.  HENT:  Head: Normocephalic and atraumatic.  Eyes: Conjunctivae are normal. No scleral icterus.  Neck: Normal range of motion.  Cardiovascular: Normal rate, regular rhythm and normal heart sounds. Exam reveals no gallop and no friction rub.  No murmur heard. Pulmonary/Chest: Effort normal and breath sounds normal. No respiratory distress.  Abdominal: Soft. Bowel sounds are normal. She exhibits no distension and no mass. There is no tenderness. There is no guarding.  Musculoskeletal:  Left knee with well-healing midline surgical incision.  Mild swelling without heat or erythema, no pain with range of motion.  Neurological: She is alert and oriented to person, place, and time. Abnormal coordination: .now.  Skin: Skin is warm and dry.  She is not diaphoretic.  Psychiatric: Her behavior is normal.  Nursing note and vitals reviewed.    ED Treatments / Results  Labs (all labs ordered are listed, but only abnormal results are displayed) Labs Reviewed  CBC WITH DIFFERENTIAL/PLATELET - Abnormal; Notable for the following components:      Result Value   RBC 5.31 (*)    Hemoglobin 15.6 (*)    All other components within normal limits  COMPREHENSIVE METABOLIC PANEL - Abnormal; Notable for the following components:   CO2 21 (*)    Glucose, Bld 176 (*)    BUN 33 (*)    Alkaline Phosphatase 133 (*)    Total Bilirubin 1.7 (*)    GFR calc non Af Amer 59 (*)    All other components within normal limits  I-STAT CG4 LACTIC ACID, ED - Abnormal; Notable for the following components:   Lactic Acid, Venous 4.45 (*)    All other components within normal limits  CULTURE, BLOOD (ROUTINE X 2)  CULTURE, BLOOD (ROUTINE X 2)  URINALYSIS, ROUTINE W REFLEX MICROSCOPIC  I-STAT CG4 LACTIC ACID, ED    EKG  EKG Interpretation None       Radiology Dg Chest 2 View  Result Date: 02/09/2017 CLINICAL DATA:  Partial knee replacement.  Postop film. EXAM: CHEST  2 VIEW COMPARISON:  July 19, 2010 FINDINGS: The heart size and mediastinal contours are within normal limits. Both lungs are clear. The visualized skeletal structures are unremarkable. IMPRESSION: No active cardiopulmonary disease. Electronically Signed   By: Dorise Bullion III M.D   On: 02/09/2017 11:55   Dg Knee Complete 4 Views Left  Result Date: 02/09/2017 CLINICAL DATA:  Postop film EXAM: LEFT KNEE - COMPLETE 4+ VIEW COMPARISON:  None. FINDINGS: The patient is status post partial left knee replacement with replacement of the medial compartment. Hardware is in good position with no evidence of failure or loosening. No other acute abnormalities. Mild soft tissue edema. No effusion. IMPRESSION: Partial knee replacement with no evidence of failure. Electronically Signed   By: Dorise Bullion III M.D   On: 02/09/2017 11:56    Procedures Procedures (including critical care time)  Medications Ordered in ED Medications  sodium chloride 0.9 % bolus 1,000 mL (1,000 mLs Intravenous New Bag/Given 02/09/17 1523)    And  sodium chloride 0.9 % bolus 1,000 mL (0 mLs Intravenous Stopped 02/09/17 1515)    And  sodium chloride 0.9 % bolus 500 mL (0 mLs Intravenous Stopped 02/09/17 1517)  piperacillin-tazobactam (ZOSYN) IVPB 3.375 g (3.375 g Intravenous New Bag/Given 02/09/17 1518)  vancomycin (VANCOCIN) IVPB 1000 mg/200 mL premix (1,000 mg Intravenous New Bag/Given 02/09/17 1523)  piperacillin-tazobactam (ZOSYN) IVPB 3.375 g (not administered)  vancomycin (VANCOCIN) IVPB 750 mg/150 ml premix (not administered)     Initial Impression / Assessment and Plan / ED Course  I have reviewed the triage vital signs and the nursing notes.  Pertinent labs & imaging results that were available during my care of the patient were reviewed by me and considered in my medical decision making (see chart for details).  Clinical Course as of Feb 09 1525  Mon Feb 09, 2017  1337 Appears hemoconcentrated Hemoglobin: (!) 15.6 [AH]  1338 BUN: (!) 33 [AH]  1338 Patient being treated under the sepsis protocol.  Lactic Acid, Venous: (!!) 4.45 [AH]  1524 Patient will be admitted for ? Sepsis v dehydration.   [AH]    Clinical Course User Index [AH] Margarita Mail, PA-C      Final Clinical Impressions(s) / ED Diagnoses   Final diagnoses:  Sepsis, due to unspecified organism [facility])    ED Discharge Orders    None       Margarita Mail, PA-C 02/09/17 1530    Julianne Rice, MD 02/10/17 [phone]

## 2017-02-10 ENCOUNTER — Other Ambulatory Visit: Payer: Self-pay

## 2017-02-10 DIAGNOSIS — M1990 Unspecified osteoarthritis, unspecified site: Secondary | ICD-10-CM | POA: Diagnosis not present

## 2017-02-10 DIAGNOSIS — I10 Essential (primary) hypertension: Secondary | ICD-10-CM | POA: Diagnosis not present

## 2017-02-10 DIAGNOSIS — E860 Dehydration: Secondary | ICD-10-CM | POA: Diagnosis not present

## 2017-02-10 DIAGNOSIS — R531 Weakness: Secondary | ICD-10-CM | POA: Diagnosis not present

## 2017-02-10 LAB — COMPREHENSIVE METABOLIC PANEL
ALBUMIN: 3 g/dL — AB (ref 3.5–5.0)
ALT: 19 U/L (ref 14–54)
AST: 20 U/L (ref 15–41)
Alkaline Phosphatase: 102 U/L (ref 38–126)
Anion gap: 6 (ref 5–15)
BILIRUBIN TOTAL: 1.7 mg/dL — AB (ref 0.3–1.2)
BUN: 21 mg/dL — AB (ref 6–20)
CO2: 23 mmol/L (ref 22–32)
CREATININE: 0.71 mg/dL (ref 0.44–1.00)
Calcium: 8.3 mg/dL — ABNORMAL LOW (ref 8.9–10.3)
Chloride: 111 mmol/L (ref 101–111)
GFR calc Af Amer: >60 mL/min (ref >60)
GLUCOSE: 120 mg/dL — AB (ref 65–99)
Potassium: 3.2 mmol/L — ABNORMAL LOW (ref 3.5–5.1)
Sodium: 140 mmol/L (ref 135–145)
TOTAL PROTEIN: 6.4 g/dL — AB (ref 6.5–8.1)

## 2017-02-10 LAB — BLOOD CULTURE ID PANEL (REFLEXED)

## 2017-02-10 LAB — CBC
HEMATOCRIT: 37 % (ref 36.0–46.0)
HEMOGLOBIN: 12.9 g/dL (ref 12.0–15.0)
MCH: 29.8 pg (ref 26.0–34.0)
MCHC: 34.9 g/dL (ref 30.0–36.0)
MCV: 85.5 fL (ref 78.0–100.0)
Platelets: 245 10*3/uL (ref 150–400)
RBC: 4.33 MIL/uL (ref 3.87–5.11)
RDW: 13.3 % (ref 11.5–15.5)
WBC: 6.4 10*3/uL (ref 4.0–10.5)

## 2017-02-10 NOTE — Discharge Summary (Signed)
Discharge Summary  Joanna Hays BSJ:[id number] DOB: 09-Oct-1940  PCP: Lajean Manes, MD  Admit date: 02/09/2017 Discharge date: 02/10/2017  Time spent: <30 mins  Recommendations for Outpatient Follow-up:  1. PCP   Discharge Diagnoses:  Active Hospital Problems   Diagnosis Date Noted  . Dehydration 02/09/2017  . Hypertension 02/09/2017  . Arthritis 02/09/2017  . 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 Endometrial ca [facility]) 07/17/2011    Resolved Hospital Problems  No resolved problems to display.    Discharge Condition: Stable  Diet recommendation: Heart healthy  Vitals:   02/10/17 0915 02/10/17 0953  BP:  140/77  Pulse:    Resp: 18   Temp:    SpO2:      History of present illness:  Joanna Hays is a 76 y.o. 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  She states that although healing well from the left knee, she feels exhausted, not been ambulating much.  She denies any chest pain or palpitations.  No dizziness, vertigo, syncope or presyncope.  She denies any pleuritic chest pain.  She denies any nausea or vomiting.  Denies any abdominal pain, dysuria or gross hematuria.  She denies any melena or hematochezia. Sse denies any significant amount of NSAIDS or appetite suppressant. In the ED, Lactic acid was noted to be 4.45, for which she receive generous IV fluid, initially thought to be septic, vancomycin and Zosyn were ordered. Pt admitted for further management and observation.  Today, pt noted to be feeling a whole lot better, denies any new complains, no chest pain, SOB, N/V/D/C, abdominal pain,  fever/chills.   Hospital Course:  Active Problems:   Endometrial ca [facility])   S/P left unicompartmental knee replacement   Dehydration   Hypertension   Arthritis  Generalized weakness in the setting of severe dehydration.  Lactic acid on presentation was noted to be 4.45, for which she receive generous IV fluid and resolved Initially thought to be septic, but due to negative septic work up, IV antibiotics were not continued U/A neg BC X 2, NGTD Chest x-ray was negative for acute findings. Left knee x-ray was negative for any infectious process Stable for discharge and to follow up with PCP  Hypertension Stable Continue home meds  Athritis , s/p L knee replacement, no acute issues, patient able to mobilize without difficulty.  She declines any PT or OT.    Procedures:  None   Consultations:  None  Discharge Exam: BP 140/77 (BP Location: Left Arm)   Pulse 86   Temp 97.8 F (36.6 C) (Oral)   Resp 18   Ht 5' (1.524 m)   Wt 81.3 kg (179 lb 3.7 oz)   SpO2 96%   BMI 35.00 kg/m   General: AAO X 3, NAD Cardiovascular: S1-S2 present, no added sound  Respiratory: Chest clear bilaterally   Discharge Instructions You were cared for by a hospitalist during your hospital stay. If you have any questions about your discharge medications or the care you received while you were in the hospital after you are discharged, you can call the unit and asked to speak with the hospitalist on call if the hospitalist that took care of you is  not available. Once you are discharged, your primary care physician will handle any further medical issues. Please note that NO REFILLS for any discharge medications will be authorized once you are discharged, as it is imperative that you return to your primary care physician (or establish a relationship with a primary care physician if you do not have one) for your aftercare needs so that they can reassess your need for medications and monitor your lab  values.  Discharge Instructions    Diet - low sodium heart healthy   Complete by:  As directed    Increase activity slowly   Complete by:  As directed      Allergies as of 02/10/2017      Reactions   Sulfa Antibiotics Other (See Comments)   UNSPECIFIED REACTION       Medication List    TAKE these medications   aspirin 325 MG tablet Take 1 tablet (325 mg total) by mouth 2 (two) times daily.   metoprolol succinate 25 MG 24 hr tablet Commonly known as:  TOPROL-XL Take 25 mg by mouth daily.   triamterene-hydrochlorothiazide 37.5-25 MG capsule Commonly known as:  DYAZIDE Take 1 capsule by mouth daily.      Allergies  Allergen Reactions  . Sulfa Antibiotics Other (See Comments)    UNSPECIFIED REACTION    Follow-up Information    Stoneking, Hal, MD. Schedule an appointment as soon as possible for a visit in 1 week(s).   Specialty:  Internal Medicine Contact information: [address] [place] [address] [phone]            The results of significant diagnostics from this hospitalization (including imaging, microbiology, ancillary and laboratory) are listed below for reference.    Significant Diagnostic Studies: Dg Chest 2 View  Result Date: 02/09/2017 CLINICAL DATA:  Partial knee replacement.  Postop film. EXAM: CHEST  2 VIEW COMPARISON:  July 19, 2010 FINDINGS: The heart size and mediastinal contours are within normal limits. Both lungs are clear. The visualized skeletal structures are unremarkable. IMPRESSION: No active cardiopulmonary disease. Electronically Signed   By: Dorise Bullion III M.D   On: 02/09/2017 11:55   Dg Knee Complete 4 Views Left  Result Date: 02/09/2017 CLINICAL DATA:  Postop film EXAM: LEFT KNEE - COMPLETE 4+ VIEW COMPARISON:  None. FINDINGS: The patient is status post partial left knee replacement with replacement of the medial compartment. Hardware is in good position with no evidence of failure or loosening. No other  acute abnormalities. Mild soft tissue edema. No effusion. IMPRESSION: Partial knee replacement with no evidence of failure. Electronically Signed   By: Dorise Bullion III M.D   On: 02/09/2017 11:56    Microbiology: Recent Results (from the past 240 hour(s))  Blood Culture (routine x 2)     Status: None (Preliminary result)   Collection Time: 02/09/17  1:26 PM  Result Value Ref Range Status   Specimen Description BLOOD RIGHT HAND  Final   Special Requests   Final    BOTTLES DRAWN AEROBIC AND ANAEROBIC Blood Culture adequate volume   Culture NO GROWTH < 24 HOURS  Final   Report Status PENDING  Incomplete  Blood Culture (routine x 2)     Status: None (Preliminary result)   Collection Time: 02/09/17  3:15 PM  Result Value Ref Range Status   Specimen Description BLOOD LEFT ANTECUBITAL  Final   Special Requests   Final    BOTTLES DRAWN AEROBIC AND ANAEROBIC Blood Culture  adequate volume   Culture NO GROWTH < 24 HOURS  Final   Report Status PENDING  Incomplete     Labs: Basic Metabolic Panel: Recent Labs  Lab 02/09/17 1114 02/10/17 0215  NA 137 140  K 3.6 3.2*  CL 103 111  CO2 21* 23  GLUCOSE 176* 120*  BUN 33* 21*  CREATININE 0.91 0.71  CALCIUM 9.7 8.3*   Liver Function Tests: Recent Labs  Lab 02/09/17 1114 02/10/17 0215  AST 31 20  ALT 22 19  ALKPHOS 133* 102  BILITOT 1.7* 1.7*  PROT 7.9 6.4*  ALBUMIN 3.9 3.0*   No results for input(s): LIPASE, AMYLASE in the last 168 hours. No results for input(s): AMMONIA in the last 168 hours. CBC: Recent Labs  Lab 02/09/17 1114 02/10/17 0215  WBC 8.4 6.4  NEUTROABS 5.9  --   HGB 15.6* 12.9  HCT 44.9 37.0  MCV 84.6 85.5  PLT 284 245   Cardiac Enzymes: No results for input(s): CKTOTAL, CKMB, CKMBINDEX, TROPONINI in the last 168 hours. BNP: BNP (last 3 results) No results for input(s): BNP in the last 8760 hours.  ProBNP (last 3 results) No results for input(s): PROBNP in the last 8760 hours.  CBG: No results for  input(s): GLUCAP in the last 168 hours.     Signed:  Alma Friendly, MD [facility] 02/10/2017, 4:54 PM

## 2017-02-12 LAB — CULTURE, BLOOD (ROUTINE X 2): SPECIAL REQUESTS: ADEQUATE

## 2017-02-14 LAB — CULTURE, BLOOD (ROUTINE X 2)
Culture: NO GROWTH
SPECIAL REQUESTS: ADEQUATE

## 2017-05-08 ENCOUNTER — Other Ambulatory Visit: Payer: Self-pay | Admitting: Obstetrics and Gynecology

## 2017-05-08 DIAGNOSIS — R921 Mammographic calcification found on diagnostic imaging of breast: Secondary | ICD-10-CM

## 2017-05-14 ENCOUNTER — Ambulatory Visit
Admission: RE | Admit: 2017-05-14 | Discharge: 2017-05-14 | Disposition: A | Discharge status: Home / Self Care | Payer: Medicare Other | Source: Ambulatory Visit | Attending: Obstetrics and Gynecology | Admitting: Obstetrics and Gynecology

## 2017-05-14 DIAGNOSIS — R921 Mammographic calcification found on diagnostic imaging of breast: Secondary | ICD-10-CM

## 2017-12-22 ENCOUNTER — Other Ambulatory Visit: Payer: Self-pay | Admitting: Obstetrics and Gynecology

## 2017-12-22 DIAGNOSIS — N950 Postmenopausal bleeding: Secondary | ICD-10-CM

## 2017-12-24 ENCOUNTER — Ambulatory Visit
Admission: RE | Admit: 2017-12-24 | Discharge: 2017-12-24 | Disposition: A | Discharge status: Home / Self Care | Payer: Medicare Other | Source: Ambulatory Visit | Attending: Obstetrics and Gynecology | Admitting: Obstetrics and Gynecology

## 2017-12-24 DIAGNOSIS — N950 Postmenopausal bleeding: Secondary | ICD-10-CM

## 2017-12-24 MED ORDER — IOPAMIDOL (ISOVUE-300) INJECTION 61%
100.0000 mL | Freq: Once | INTRAVENOUS | Status: AC | PRN
  Administered 2017-12-24: 100 mL via INTRAVENOUS

## 2018-01-21 ENCOUNTER — Other Ambulatory Visit: Payer: Self-pay | Admitting: Urology

## 2018-01-25 ENCOUNTER — Other Ambulatory Visit: Payer: Self-pay | Admitting: Urology

## 2018-01-25 ENCOUNTER — Ambulatory Visit: Payer: Self-pay | Admitting: General Surgery

## 2018-03-05 NOTE — Patient Instructions (Signed)
Joanna Hays  03/05/2018   Your procedure is scheduled on: 03-12-2018    Report to [facility] Main  Entrance     Report to admitting at 5:30AM    Call this number if you have problems the morning of surgery [phone]      Remember: Please follow all bowel prep instructions provided by your surgeon!   DRINK 2 PRESURGERY ENSURE DRINKS THE NIGHT BEFORE SURGERY AT 10:00pm  NO SOLIDS AFTER MIDNIGHT THE DAY PRIOR TO THE SURGERY. CONTINUE CLEAR LIQUIDS UNTIL THREE HOURS PRIOR TO SCHEDULED SURGERY.  DRINK  1 PRESURGERY DRINK THE DAY OF THE PROCEDURE AT ____4:15AM_____.   BRUSH YOUR TEETH MORNING OF SURGERY AND RINSE YOUR MOUTH OUT, NO CHEWING GUM CANDY OR MINTS.      CLEAR LIQUID DIET   Foods Allowed                                                                     Foods Excluded  Coffee and tea, regular and decaf                             liquids that you cannot  Plain Jell-O in any flavor                                             see through such as: Fruit ices (not with fruit pulp)                                     milk, soups, orange juice  Iced Popsicles                                    All solid food Carbonated beverages, regular and diet                                    Cranberry, grape and apple juices Sports drinks like Gatorade Lightly seasoned clear broth or consume(fat free) Sugar, honey syrup  Sample Menu Breakfast                                Lunch                                     Supper Cranberry juice                    Beef broth                            Chicken broth Jell-O  Grape juice                           Apple juice Coffee or tea                        Jell-O                                      Popsicle                                                Coffee or tea                        Coffee or  tea  _____________________________________________________________________       Take these medicines the morning of surgery with A SIP OF WATER: METOPROLOL                                You may not have any metal on your body including hair pins and              piercings  Do not wear jewelry, make-up, lotions, powders or perfumes, deodorant             Do not wear nail polish.  Do not shave  48 hours prior to surgery.               Do not bring valuables to the hospital. [place].  Contacts, dentures or bridgework may not be worn into surgery.  Leave suitcase in the car. After surgery it may be brought to your room.                  Please read over the following fact sheets you were given: _____________________________________________________________________             [facility] - Preparing for Surgery Before surgery, you can play an important role.  Because skin is not sterile, your skin needs to be as free of germs as possible.  You can reduce the number of germs on your skin by washing with CHG (chlorahexidine gluconate) soap before surgery.  CHG is an antiseptic cleaner which kills germs and bonds with the skin to continue killing germs even after washing. Please DO NOT use if you have an allergy to CHG or antibacterial soaps.  If your skin becomes reddened/irritated stop using the CHG and inform your nurse when you arrive at Short Stay. Do not shave (including legs and underarms) for at least 48 hours prior to the first CHG shower.  You may shave your face/neck. Please follow these instructions carefully:  1.  Shower with CHG Soap the night before surgery and the  morning of Surgery.  2.  If you choose to wash your hair, wash your hair first as usual with your  normal  shampoo.  3.  After you shampoo, rinse your hair and body thoroughly to remove the  shampoo.  4.  Use CHG as you would any other  liquid soap.  You can apply chg directly  to the skin and wash                       Gently with a scrungie or clean washcloth.  5.  Apply the CHG Soap to your body ONLY FROM THE NECK DOWN.   Do not use on face/ open                           Wound or open sores. Avoid contact with eyes, ears mouth and genitals (private parts).                       Wash face,  Genitals (private parts) with your normal soap.             6.  Wash thoroughly, paying special attention to the area where your surgery  will be performed.  7.  Thoroughly rinse your body with warm water from the neck down.  8.  DO NOT shower/wash with your normal soap after using and rinsing off  the CHG Soap.                9.  Pat yourself dry with a clean towel.            10.  Wear clean pajamas.            11.  Place clean sheets on your bed the night of your first shower and do not  sleep with pets. Day of Surgery : Do not apply any lotions/deodorants the morning of surgery.  Please wear clean clothes to the hospital/surgery center.  FAILURE TO FOLLOW THESE INSTRUCTIONS MAY RESULT IN THE CANCELLATION OF YOUR SURGERY PATIENT SIGNATURE_________________________________  NURSE SIGNATURE__________________________________  ________________________________________________________________________   Adam Phenix  An incentive spirometer is a tool that can help keep your lungs clear and active. This tool measures how well you are filling your lungs with each breath. Taking long deep breaths may help reverse or decrease the chance of developing breathing (pulmonary) problems (especially infection) following:  A long period of time when you are unable to move or be active. BEFORE THE PROCEDURE   If the spirometer includes an indicator to show your best effort, your nurse or respiratory therapist will set it to a desired goal.  If possible, sit up straight or lean slightly forward. Try not to slouch.  Hold the incentive  spirometer in an upright position. INSTRUCTIONS FOR USE  1. Sit on the edge of your bed if possible, or sit up as far as you can in bed or on a chair. 2. Hold the incentive spirometer in an upright position. 3. Breathe out normally. 4. Place the mouthpiece in your mouth and seal your lips tightly around it. 5. Breathe in slowly and as deeply as possible, raising the piston or the ball toward the top of the column. 6. Hold your breath for 3-5 seconds or for as long as possible. Allow the piston or ball to fall to the bottom of the column. 7. Remove the mouthpiece from your mouth and breathe out normally. 8. Rest for a few seconds and repeat Steps 1 through 7 at least 10 times every 1-2 hours when you are awake. Take your time and take a few normal breaths between deep breaths. 9. The spirometer may include an indicator to  show your best effort. Use the indicator as a goal to work toward during each repetition. 10. After each set of 10 deep breaths, practice coughing to be sure your lungs are clear. If you have an incision (the cut made at the time of surgery), support your incision when coughing by placing a pillow or rolled up towels firmly against it. Once you are able to get out of bed, walk around indoors and cough well. You may stop using the incentive spirometer when instructed by your caregiver.  RISKS AND COMPLICATIONS  Take your time so you do not get dizzy or light-headed.  If you are in pain, you may need to take or ask for pain medication before doing incentive spirometry. It is harder to take a deep breath if you are having pain. AFTER USE  Rest and breathe slowly and easily.  It can be helpful to keep track of a log of your progress. Your caregiver can provide you with a simple table to help with this. If you are using the spirometer at home, follow these instructions: [place] IF:   You are having difficultly using the spirometer.  You have trouble using the  spirometer as often as instructed.  Your pain medication is not giving enough relief while using the spirometer.  You develop fever of 100.5 F (38.1 C) or higher. SEEK IMMEDIATE MEDICAL CARE IF:   You cough up bloody sputum that had not been present before.  You develop fever of 102 F (38.9 C) or greater.  You develop worsening pain at or near the incision site. MAKE SURE YOU:   Understand these instructions.  Will watch your condition.  Will get help right away if you are not doing well or get worse. Document Released: 06/16/2006 Document Revised: 04/28/2011 Document Reviewed: 08/17/2006 [facility] Patient Information 2014 [place], [place].   ________________________________________________________________________

## 2018-03-08 ENCOUNTER — Other Ambulatory Visit: Payer: Self-pay

## 2018-03-08 ENCOUNTER — Encounter (HOSPITAL_COMMUNITY): Payer: Self-pay

## 2018-03-08 ENCOUNTER — Encounter (HOSPITAL_COMMUNITY)
Admission: RE | Admit: 2018-03-08 | Discharge: 2018-03-08 | Disposition: A | Discharge status: Home / Self Care | Payer: Medicare Other | Source: Ambulatory Visit | Attending: General Surgery | Admitting: General Surgery

## 2018-03-08 DIAGNOSIS — Z01818 Encounter for other preprocedural examination: Secondary | ICD-10-CM | POA: Diagnosis not present

## 2018-03-08 HISTORY — DX: Vesicointestinal fistula: N32.1

## 2018-03-08 HISTORY — DX: Diverticulosis of intestine, part unspecified, without perforation or abscess without bleeding: K57.90

## 2018-03-08 LAB — ABO/RH: ABO/RH(D): A POS

## 2018-03-08 LAB — CBC
HCT: 44.3 % (ref 36.0–46.0)
Hemoglobin: 14.3 g/dL (ref 12.0–15.0)
MCH: 28.1 pg (ref 26.0–34.0)
MCHC: 32.3 g/dL (ref 30.0–36.0)
MCV: 87 fL (ref 80.0–100.0)
Platelets: 276 10*3/uL (ref 150–400)
RBC: 5.09 MIL/uL (ref 3.87–5.11)
RDW: 13.6 % (ref 11.5–15.5)
WBC: 6.2 10*3/uL (ref 4.0–10.5)
nRBC: 0 % (ref 0.0–0.2)

## 2018-03-08 LAB — BASIC METABOLIC PANEL
Anion gap: 6 (ref 5–15)
BUN: 18 mg/dL (ref 8–23)
CO2: 27 mmol/L (ref 22–32)
CREATININE: 0.81 mg/dL (ref 0.44–1.00)
Calcium: 9.4 mg/dL (ref 8.9–10.3)
Chloride: 106 mmol/L (ref 98–111)
GFR calc Af Amer: >60 mL/min (ref >60)
Glucose, Bld: 106 mg/dL — ABNORMAL HIGH (ref 70–99)
Potassium: 3.9 mmol/L (ref 3.5–5.1)
Sodium: 139 mmol/L (ref 135–145)

## 2018-03-08 NOTE — Progress Notes (Signed)
[place] nurse Melody paged

## 2018-03-08 NOTE — Consult Note (Signed)
[place] Nurse requested for preoperative stoma site marking  Discussed surgical procedure and stoma creation with patient and family.  Explained role of the [place] nurse team.  Provided the patient with educational booklet and provided samples of pouching options.  Answered patient and family questions.   Examined patient sitting, and standing in order to place the marking in the patient's visual field, away from any creases or abdominal contour issues and within the rectus muscle.  Attempted to mark below the patient's belt line.   Marked for colostomy in the LLQ  _5cm___ cm to the left of the umbilicus and _8___BT below the umbilicus.   Patient's abdomen cleansed with CHG wipes at site markings, allowed to air dry prior to marking.Covered mark with thin film transparent dressing to preserve mark until date of surgery.   [place] Nurse team will follow up with patient after surgery for continue ostomy care and teaching.  [place] MSN, [place], [place], [place]

## 2018-03-11 MED ORDER — BUPIVACAINE LIPOSOME 1.3 % IJ SUSP
20.0000 mL | Freq: Once | INTRAMUSCULAR | Status: DC
  Filled 2018-03-11: qty 20

## 2018-03-11 NOTE — Anesthesia Preprocedure Evaluation (Addendum)
Anesthesia Evaluation  Patient identified by MRN, date of birth, ID band Patient awake    Reviewed: Allergy & Precautions, NPO status , Patient's Chart, lab work & pertinent test results  Airway Mallampati: II  TM Distance: >3 FB Neck ROM: Full    Dental no notable dental hx.    Pulmonary neg pulmonary ROS,    Pulmonary exam normal breath sounds clear to auscultation       Cardiovascular hypertension, Pt. on home beta blockers and Pt. on medications Normal cardiovascular exam Rhythm:Regular Rate:Normal  ECG: rate 71. Normal sinus rhythm Left ventricular hypertrophy   Neuro/Psych negative neurological ROS  negative psych ROS   GI/Hepatic Neg liver ROS, Diverticulosis   Endo/Other  negative endocrine ROS  Renal/GU negative Renal ROS     Musculoskeletal negative musculoskeletal ROS (+)   Abdominal   Peds  Hematology negative hematology ROS (+)   Anesthesia Other Findings Colovaginal fistula  Reproductive/Obstetrics                            Anesthesia Physical Anesthesia Plan  ASA: II  Anesthesia Plan: General   Post-op Pain Management:    Induction: Intravenous  PONV Risk Score and Plan: 4 or greater and Ondansetron, Dexamethasone, Midazolam and Treatment may vary due to age or medical condition  Airway Management Planned: Oral ETT  Additional Equipment:   Intra-op Plan:   Post-operative Plan:   Informed Consent: I have reviewed the patients History and Physical, chart, labs and discussed the procedure including the risks, benefits and alternatives for the proposed anesthesia with the patient or authorized representative who has indicated his/her understanding and acceptance.     Dental advisory given  Plan Discussed with: CRNA  Anesthesia Plan Comments: (Multi-modal: acetaminophen, gabapentin, and dexamethasone)      Anesthesia Quick Evaluation

## 2018-03-12 ENCOUNTER — Other Ambulatory Visit: Payer: Self-pay

## 2018-03-12 ENCOUNTER — Encounter (HOSPITAL_COMMUNITY)
Admission: RE | Disposition: A | Discharge status: Home / Self Care | Payer: Self-pay | Source: Home / Self Care | Attending: General Surgery

## 2018-03-12 ENCOUNTER — Inpatient Hospital Stay (HOSPITAL_COMMUNITY)
Admission: RE | Admit: 2018-03-12 | Discharge: 2018-03-15 | DRG: 330 | Disposition: A | Discharge status: Home / Self Care | Payer: Medicare Other | Attending: General Surgery | Admitting: General Surgery

## 2018-03-12 ENCOUNTER — Inpatient Hospital Stay (HOSPITAL_COMMUNITY): Payer: Medicare Other

## 2018-03-12 ENCOUNTER — Inpatient Hospital Stay (HOSPITAL_COMMUNITY): Payer: Medicare Other | Admitting: Physician Assistant

## 2018-03-12 ENCOUNTER — Inpatient Hospital Stay (HOSPITAL_COMMUNITY): Payer: Medicare Other | Admitting: Anesthesiology

## 2018-03-12 ENCOUNTER — Encounter (HOSPITAL_COMMUNITY): Payer: Self-pay

## 2018-03-12 DIAGNOSIS — Z882 Allergy status to sulfonamides status: Secondary | ICD-10-CM | POA: Diagnosis not present

## 2018-03-12 DIAGNOSIS — Z90710 Acquired absence of both cervix and uterus: Secondary | ICD-10-CM | POA: Diagnosis not present

## 2018-03-12 DIAGNOSIS — Z833 Family history of diabetes mellitus: Secondary | ICD-10-CM

## 2018-03-12 DIAGNOSIS — Z9841 Cataract extraction status, right eye: Secondary | ICD-10-CM

## 2018-03-12 DIAGNOSIS — Z79899 Other long term (current) drug therapy: Secondary | ICD-10-CM | POA: Diagnosis not present

## 2018-03-12 DIAGNOSIS — R197 Diarrhea, unspecified: Secondary | ICD-10-CM | POA: Diagnosis not present

## 2018-03-12 DIAGNOSIS — K630 Abscess of intestine: Secondary | ICD-10-CM | POA: Diagnosis present

## 2018-03-12 DIAGNOSIS — Z8542 Personal history of malignant neoplasm of other parts of uterus: Secondary | ICD-10-CM

## 2018-03-12 DIAGNOSIS — I119 Hypertensive heart disease without heart failure: Secondary | ICD-10-CM | POA: Diagnosis present

## 2018-03-12 DIAGNOSIS — N824 Other female intestinal-genital tract fistulae: Secondary | ICD-10-CM | POA: Diagnosis present

## 2018-03-12 DIAGNOSIS — K5790 Diverticulosis of intestine, part unspecified, without perforation or abscess without bleeding: Secondary | ICD-10-CM | POA: Diagnosis present

## 2018-03-12 DIAGNOSIS — N823 Fistula of vagina to large intestine: Principal | ICD-10-CM | POA: Diagnosis present

## 2018-03-12 DIAGNOSIS — N1330 Unspecified hydronephrosis: Secondary | ICD-10-CM | POA: Diagnosis present

## 2018-03-12 HISTORY — PX: CYSTOSCOPY WITH INSERTION OF UROLIFT: SHX6678

## 2018-03-12 LAB — TYPE AND SCREEN
ABO/RH(D): A POS
Antibody Screen: NEGATIVE

## 2018-03-12 SURGERY — COLECTOMY, PARTIAL, ROBOT-ASSISTED, LAPAROSCOPIC
Anesthesia: General

## 2018-03-12 MED ORDER — PHENYLEPHRINE 40 MCG/ML (10ML) SYRINGE FOR IV PUSH (FOR BLOOD PRESSURE SUPPORT)
PREFILLED_SYRINGE | INTRAVENOUS | Status: AC
  Filled 2018-03-12: qty 10

## 2018-03-12 MED ORDER — PROPOFOL 10 MG/ML IV BOLUS
INTRAVENOUS | Status: DC | PRN
  Administered 2018-03-12: 140 mg via INTRAVENOUS

## 2018-03-12 MED ORDER — ALUM & MAG HYDROXIDE-SIMETH 200-200-20 MG/5ML PO SUSP: 30.0000 mL | Freq: Four times a day (QID) | ORAL | Status: DC | PRN

## 2018-03-12 MED ORDER — FENTANYL CITRATE (PF) 250 MCG/5ML IJ SOLN
INTRAMUSCULAR | Status: AC
  Filled 2018-03-12: qty 5

## 2018-03-12 MED ORDER — SACCHAROMYCES BOULARDII 250 MG PO CAPS
250.0000 mg | ORAL_CAPSULE | Freq: Two times a day (BID) | ORAL | Status: DC
  Administered 2018-03-12 – 2018-03-13 (×3): 250 mg via ORAL
  Filled 2018-03-12 (×5): qty 1

## 2018-03-12 MED ORDER — SODIUM CHLORIDE 0.9 % IV SOLN
INTRAVENOUS | Status: DC | PRN
  Administered 2018-03-12: 09:00:00

## 2018-03-12 MED ORDER — LIDOCAINE 20MG/ML (2%) 15 ML SYRINGE OPTIME
INTRAMUSCULAR | Status: DC | PRN
  Administered 2018-03-12: 1 mg/kg/h via INTRAVENOUS

## 2018-03-12 MED ORDER — ONDANSETRON HCL 4 MG PO TABS: 4.0000 mg | ORAL_TABLET | Freq: Four times a day (QID) | ORAL | Status: DC | PRN

## 2018-03-12 MED ORDER — SODIUM CHLORIDE 0.9 % IV SOLN
2.0000 g | INTRAVENOUS | Status: AC
  Administered 2018-03-12: 2 g via INTRAVENOUS
  Filled 2018-03-12: qty 2

## 2018-03-12 MED ORDER — SODIUM CHLORIDE 0.9 % IV SOLN
INTRAVENOUS | Status: DC | PRN
  Administered 2018-03-12: 30 ug/min via INTRAVENOUS

## 2018-03-12 MED ORDER — METHYLENE BLUE 0.5 % INJ SOLN
INTRAVENOUS | Status: AC
  Filled 2018-03-12: qty 10

## 2018-03-12 MED ORDER — ONDANSETRON HCL 4 MG/2ML IJ SOLN
INTRAMUSCULAR | Status: DC | PRN
  Administered 2018-03-12: 4 mg via INTRAVENOUS

## 2018-03-12 MED ORDER — ACETAMINOPHEN 500 MG PO TABS
1000.0000 mg | ORAL_TABLET | ORAL | Status: AC
  Administered 2018-03-12: 1000 mg via ORAL
  Filled 2018-03-12: qty 2

## 2018-03-12 MED ORDER — KCL IN DEXTROSE-NACL 20-5-0.45 MEQ/L-%-% IV SOLN
INTRAVENOUS | Status: DC
  Administered 2018-03-12: 15:00:00 via INTRAVENOUS
  Filled 2018-03-12 (×2): qty 1000

## 2018-03-12 MED ORDER — PROPOFOL 10 MG/ML IV BOLUS
INTRAVENOUS | Status: AC
  Filled 2018-03-12: qty 20

## 2018-03-12 MED ORDER — FAMOTIDINE IN NACL 20-0.9 MG/50ML-% IV SOLN
20.0000 mg | Freq: Once | INTRAVENOUS | Status: AC
  Administered 2018-03-12: 20 mg via INTRAVENOUS
  Filled 2018-03-12: qty 50

## 2018-03-12 MED ORDER — METOPROLOL SUCCINATE ER 25 MG PO TB24
25.0000 mg | ORAL_TABLET | Freq: Every day | ORAL | Status: DC
  Administered 2018-03-13 – 2018-03-15 (×3): 25 mg via ORAL
  Filled 2018-03-12 (×3): qty 1

## 2018-03-12 MED ORDER — TRAMADOL HCL 50 MG PO TABS: 50.0000 mg | ORAL_TABLET | Freq: Four times a day (QID) | ORAL | Status: DC | PRN

## 2018-03-12 MED ORDER — FENTANYL CITRATE (PF) 100 MCG/2ML IJ SOLN
INTRAMUSCULAR | Status: AC
  Filled 2018-03-12: qty 2

## 2018-03-12 MED ORDER — ESMOLOL HCL 100 MG/10ML IV SOLN
INTRAVENOUS | Status: DC | PRN
  Administered 2018-03-12: 20 mg via INTRAVENOUS

## 2018-03-12 MED ORDER — BUPIVACAINE LIPOSOME 1.3 % IJ SUSP
INTRAMUSCULAR | Status: DC | PRN
  Administered 2018-03-12: 20 mL

## 2018-03-12 MED ORDER — ENSURE SURGERY PO LIQD
237.0000 mL | Freq: Two times a day (BID) | ORAL | Status: DC
  Administered 2018-03-13 – 2018-03-14 (×2): 237 mL via ORAL
  Filled 2018-03-12 (×7): qty 237

## 2018-03-12 MED ORDER — ONDANSETRON HCL 4 MG/2ML IJ SOLN: 4.0000 mg | Freq: Four times a day (QID) | INTRAMUSCULAR | Status: DC | PRN

## 2018-03-12 MED ORDER — BUPIVACAINE-EPINEPHRINE (PF) 0.25% -1:200000 IJ SOLN
INTRAMUSCULAR | Status: AC
  Filled 2018-03-12: qty 30

## 2018-03-12 MED ORDER — RINGERS IRRIGATION IR SOLN
Status: DC | PRN
  Administered 2018-03-12: 1

## 2018-03-12 MED ORDER — EPHEDRINE SULFATE-NACL 50-0.9 MG/10ML-% IV SOSY
PREFILLED_SYRINGE | INTRAVENOUS | Status: DC | PRN
  Administered 2018-03-12 (×2): 10 mg via INTRAVENOUS

## 2018-03-12 MED ORDER — LACTATED RINGERS IV SOLN
INTRAVENOUS | Status: DC
  Administered 2018-03-12 (×2): via INTRAVENOUS

## 2018-03-12 MED ORDER — PHENYLEPHRINE 40 MCG/ML (10ML) SYRINGE FOR IV PUSH (FOR BLOOD PRESSURE SUPPORT)
PREFILLED_SYRINGE | INTRAVENOUS | Status: DC | PRN
  Administered 2018-03-12: 120 ug via INTRAVENOUS
  Administered 2018-03-12 (×2): 80 ug via INTRAVENOUS
  Administered 2018-03-12 (×3): 120 ug via INTRAVENOUS
  Administered 2018-03-12: 80 ug via INTRAVENOUS
  Administered 2018-03-12: 160 ug via INTRAVENOUS
  Administered 2018-03-12: 120 ug via INTRAVENOUS
  Administered 2018-03-12: 80 ug via INTRAVENOUS
  Administered 2018-03-12: 40 ug via INTRAVENOUS

## 2018-03-12 MED ORDER — KETAMINE HCL 10 MG/ML IJ SOLN
INTRAMUSCULAR | Status: AC
  Filled 2018-03-12: qty 1

## 2018-03-12 MED ORDER — FENTANYL CITRATE (PF) 100 MCG/2ML IJ SOLN
25.0000 ug | INTRAMUSCULAR | Status: DC | PRN
  Administered 2018-03-12: 50 ug via INTRAVENOUS

## 2018-03-12 MED ORDER — ONDANSETRON HCL 4 MG/2ML IJ SOLN: 4.0000 mg | Freq: Once | INTRAMUSCULAR | Status: DC | PRN

## 2018-03-12 MED ORDER — SUGAMMADEX SODIUM 200 MG/2ML IV SOLN
INTRAVENOUS | Status: DC | PRN
  Administered 2018-03-12: 200 mg via INTRAVENOUS

## 2018-03-12 MED ORDER — ACETAMINOPHEN 500 MG PO TABS
1000.0000 mg | ORAL_TABLET | Freq: Four times a day (QID) | ORAL | Status: DC
  Administered 2018-03-12 – 2018-03-15 (×9): 1000 mg via ORAL
  Filled 2018-03-12 (×9): qty 2

## 2018-03-12 MED ORDER — KETAMINE HCL 10 MG/ML IJ SOLN
INTRAMUSCULAR | Status: DC | PRN
  Administered 2018-03-12: 20 mg via INTRAVENOUS

## 2018-03-12 MED ORDER — METHYLENE BLUE 0.5 % INJ SOLN
INTRAVENOUS | Status: DC | PRN
  Administered 2018-03-12: 5 mL via INTRAVENOUS

## 2018-03-12 MED ORDER — ROCURONIUM BROMIDE 10 MG/ML (PF) SYRINGE
PREFILLED_SYRINGE | INTRAVENOUS | Status: DC | PRN
  Administered 2018-03-12: 10 mg via INTRAVENOUS
  Administered 2018-03-12: 60 mg via INTRAVENOUS
  Administered 2018-03-12: 10 mg via INTRAVENOUS

## 2018-03-12 MED ORDER — SODIUM CHLORIDE 0.9 % IV SOLN
2.0000 g | Freq: Two times a day (BID) | INTRAVENOUS | Status: AC
  Administered 2018-03-12: 2 g via INTRAVENOUS
  Filled 2018-03-12: qty 2

## 2018-03-12 MED ORDER — ESMOLOL HCL 100 MG/10ML IV SOLN
INTRAVENOUS | Status: AC
  Filled 2018-03-12: qty 10

## 2018-03-12 MED ORDER — GABAPENTIN 300 MG PO CAPS
300.0000 mg | ORAL_CAPSULE | ORAL | Status: AC
  Administered 2018-03-12: 300 mg via ORAL
  Filled 2018-03-12: qty 1

## 2018-03-12 MED ORDER — ENOXAPARIN SODIUM 40 MG/0.4ML ~~LOC~~ SOLN
40.0000 mg | SUBCUTANEOUS | Status: DC
  Administered 2018-03-13 – 2018-03-14 (×2): 40 mg via SUBCUTANEOUS
  Filled 2018-03-12 (×3): qty 0.4

## 2018-03-12 MED ORDER — FENTANYL CITRATE (PF) 100 MCG/2ML IJ SOLN
INTRAMUSCULAR | Status: DC | PRN
  Administered 2018-03-12: 100 ug via INTRAVENOUS
  Administered 2018-03-12 (×4): 50 ug via INTRAVENOUS

## 2018-03-12 MED ORDER — LIDOCAINE 2% (20 MG/ML) 5 ML SYRINGE
INTRAMUSCULAR | Status: AC
  Filled 2018-03-12: qty 5

## 2018-03-12 MED ORDER — 0.9 % SODIUM CHLORIDE (POUR BTL) OPTIME
TOPICAL | Status: DC | PRN
  Administered 2018-03-12: 2000 mL

## 2018-03-12 MED ORDER — DEXAMETHASONE SODIUM PHOSPHATE 10 MG/ML IJ SOLN
INTRAMUSCULAR | Status: DC | PRN
  Administered 2018-03-12: 5 mg via INTRAVENOUS

## 2018-03-12 MED ORDER — BUPIVACAINE-EPINEPHRINE 0.25% -1:200000 IJ SOLN
INTRAMUSCULAR | Status: DC | PRN
  Administered 2018-03-12: 30 mL

## 2018-03-12 MED ORDER — PHENYLEPHRINE HCL 10 MG/ML IJ SOLN
INTRAMUSCULAR | Status: AC
  Filled 2018-03-12: qty 1

## 2018-03-12 MED ORDER — ALVIMOPAN 12 MG PO CAPS
12.0000 mg | ORAL_CAPSULE | Freq: Two times a day (BID) | ORAL | Status: DC
  Filled 2018-03-12: qty 1

## 2018-03-12 MED ORDER — ALVIMOPAN 12 MG PO CAPS
12.0000 mg | ORAL_CAPSULE | ORAL | Status: AC
  Administered 2018-03-12: 12 mg via ORAL
  Filled 2018-03-12: qty 1

## 2018-03-12 MED ORDER — LIDOCAINE 2% (20 MG/ML) 5 ML SYRINGE
INTRAMUSCULAR | Status: DC | PRN
  Administered 2018-03-12: 60 mg via INTRAVENOUS

## 2018-03-12 MED ORDER — GABAPENTIN 300 MG PO CAPS
300.0000 mg | ORAL_CAPSULE | Freq: Two times a day (BID) | ORAL | Status: DC
  Administered 2018-03-12 – 2018-03-15 (×6): 300 mg via ORAL
  Filled 2018-03-12 (×7): qty 1

## 2018-03-12 MED ORDER — HYDROMORPHONE HCL 1 MG/ML IJ SOLN: 0.5000 mg | INTRAMUSCULAR | Status: DC | PRN

## 2018-03-12 SURGICAL SUPPLY — 105 items
BAG URO CATCHER STRL LF (MISCELLANEOUS) ×3 IMPLANT
BLADE EXTENDED COATED 6.5IN (ELECTRODE) IMPLANT
CANNULA REDUC XI 12-8 STAPL (CANNULA) ×1
CANNULA REDUC XI 12-8MM STAPL (CANNULA) ×1
CANNULA REDUCER 12-8 DVNC XI (CANNULA) ×1 IMPLANT
CELLS DAT CNTRL 66122 CELL SVR (MISCELLANEOUS) IMPLANT
CLIP VESOLOCK LG 6/CT PURPLE (CLIP) IMPLANT
CLIP VESOLOCK MED 6/CT (CLIP) IMPLANT
COVER SURGICAL LIGHT HANDLE (MISCELLANEOUS) ×6 IMPLANT
COVER TIP SHEARS 8 DVNC (MISCELLANEOUS) ×1 IMPLANT
COVER TIP SHEARS 8MM DA VINCI (MISCELLANEOUS) ×2
COVER WAND RF STERILE (DRAPES) ×3 IMPLANT
DECANTER SPIKE VIAL GLASS SM (MISCELLANEOUS) IMPLANT
DERMABOND ADVANCED (GAUZE/BANDAGES/DRESSINGS) ×2
DERMABOND ADVANCED .7 DNX12 (GAUZE/BANDAGES/DRESSINGS) ×1 IMPLANT
DRAIN CHANNEL 19F RND (DRAIN) IMPLANT
DRAPE ARM DVNC X/XI (DISPOSABLE) ×4 IMPLANT
DRAPE COLUMN DVNC XI (DISPOSABLE) ×1 IMPLANT
DRAPE DA VINCI XI ARM (DISPOSABLE) ×8
DRAPE DA VINCI XI COLUMN (DISPOSABLE) ×2
DRAPE SURG IRRIG POUCH 19X23 (DRAPES) ×3 IMPLANT
DRSG OPSITE POSTOP 3X4 (GAUZE/BANDAGES/DRESSINGS) ×3 IMPLANT
DRSG OPSITE POSTOP 4X10 (GAUZE/BANDAGES/DRESSINGS) IMPLANT
DRSG OPSITE POSTOP 4X6 (GAUZE/BANDAGES/DRESSINGS) IMPLANT
DRSG OPSITE POSTOP 4X8 (GAUZE/BANDAGES/DRESSINGS) IMPLANT
DRSG TEGADERM 4X4.75 (GAUZE/BANDAGES/DRESSINGS) ×3 IMPLANT
ELECT PENCIL ROCKER SW 15FT (MISCELLANEOUS) ×3 IMPLANT
ELECT REM PT RETURN 15FT ADLT (MISCELLANEOUS) ×3 IMPLANT
ENDOLOOP SUT PDS II  0 18 (SUTURE)
ENDOLOOP SUT PDS II 0 18 (SUTURE) IMPLANT
EVACUATOR SILICONE 100CC (DRAIN) IMPLANT
GAUZE SPONGE 2X2 8PLY STRL LF (GAUZE/BANDAGES/DRESSINGS) ×1 IMPLANT
GAUZE SPONGE 4X4 12PLY STRL (GAUZE/BANDAGES/DRESSINGS) IMPLANT
GLOVE BIO SURGEON STRL SZ 6.5 (GLOVE) ×6 IMPLANT
GLOVE BIO SURGEONS STRL SZ 6.5 (GLOVE) ×3
GLOVE BIOGEL M 8.0 STRL (GLOVE) ×3 IMPLANT
GLOVE BIOGEL PI IND STRL 7.0 (GLOVE) ×3 IMPLANT
GLOVE BIOGEL PI INDICATOR 7.0 (GLOVE) ×6
GOWN STRL REUS W/TWL 2XL LVL3 (GOWN DISPOSABLE) ×9 IMPLANT
GOWN STRL REUS W/TWL XL LVL3 (GOWN DISPOSABLE) ×18 IMPLANT
GRASPER ENDOPATH ANVIL 10MM (MISCELLANEOUS) IMPLANT
GRASPER SUT TROCAR 14GX15 (MISCELLANEOUS) IMPLANT
GUIDEWIRE STR DUAL SENSOR (WIRE) ×3 IMPLANT
HOLDER FOLEY CATH W/STRAP (MISCELLANEOUS) ×3 IMPLANT
IRRIG SUCT STRYKERFLOW 2 WTIP (MISCELLANEOUS) ×3
IRRIGATION SUCT STRKRFLW 2 WTP (MISCELLANEOUS) ×1 IMPLANT
IRRIGATOR SUCT 8 DISP DVNC XI (IRRIGATION / IRRIGATOR) IMPLANT
IRRIGATOR SUCTION 8MM XI DISP (IRRIGATION / IRRIGATOR)
KIT PROCEDURE DA VINCI SI (MISCELLANEOUS) ×2
KIT PROCEDURE DVNC SI (MISCELLANEOUS) ×1 IMPLANT
KIT SIGMOIDOSCOPE (SET/KITS/TRAYS/PACK) ×3 IMPLANT
MANIFOLD NEPTUNE II (INSTRUMENTS) ×3 IMPLANT
NEEDLE INSUFFLATION 14GA 120MM (NEEDLE) ×3 IMPLANT
PACK CARDIOVASCULAR III (CUSTOM PROCEDURE TRAY) ×3 IMPLANT
PACK COLON (CUSTOM PROCEDURE TRAY) ×3 IMPLANT
PACK CYSTO (CUSTOM PROCEDURE TRAY) ×3 IMPLANT
PORT LAP GEL ALEXIS MED 5-9CM (MISCELLANEOUS) ×3 IMPLANT
RTRCTR WOUND ALEXIS 18CM MED (MISCELLANEOUS)
SCISSORS LAP 5X35 DISP (ENDOMECHANICALS) ×3 IMPLANT
SEAL CANN UNIV 5-8 DVNC XI (MISCELLANEOUS) ×3 IMPLANT
SEAL XI 5MM-8MM UNIVERSAL (MISCELLANEOUS) ×6
SEALER VESSEL DA VINCI XI (MISCELLANEOUS) ×2
SEALER VESSEL EXT DVNC XI (MISCELLANEOUS) ×1 IMPLANT
SLEEVE ADV FIXATION 5X100MM (TROCAR) IMPLANT
SOLUTION ELECTROLUBE (MISCELLANEOUS) ×3 IMPLANT
SPONGE GAUZE 2X2 STER 10/PKG (GAUZE/BANDAGES/DRESSINGS) ×2
STAPLER 45 BLU RELOAD XI (STAPLE) IMPLANT
STAPLER 45 BLUE RELOAD XI (STAPLE)
STAPLER 45 GREEN RELOAD XI (STAPLE) ×2
STAPLER 45 GRN RELOAD XI (STAPLE) ×1 IMPLANT
STAPLER CANNULA SEAL DVNC XI (STAPLE) ×1 IMPLANT
STAPLER CANNULA SEAL XI (STAPLE) ×2
STAPLER ECHELON POWER CIR 29 (STAPLE) ×3 IMPLANT
STAPLER SHEATH (SHEATH) ×2
STAPLER SHEATH ENDOWRIST DVNC (SHEATH) ×1 IMPLANT
STAPLER VISISTAT 35W (STAPLE) IMPLANT
SUT ETHILON 2 0 PS N (SUTURE) IMPLANT
SUT NOVA NAB DX-16 0-1 5-0 T12 (SUTURE) ×6 IMPLANT
SUT PROLENE 2 0 KS (SUTURE) ×3 IMPLANT
SUT SILK 2 0 (SUTURE) ×2
SUT SILK 2 0 SH CR/8 (SUTURE) IMPLANT
SUT SILK 2-0 18XBRD TIE 12 (SUTURE) ×1 IMPLANT
SUT SILK 3 0 (SUTURE) ×2
SUT SILK 3 0 SH CR/8 (SUTURE) IMPLANT
SUT SILK 3-0 18XBRD TIE 12 (SUTURE) ×1 IMPLANT
SUT V-LOC BARB 180 2/0GR6 GS22 (SUTURE)
SUT VIC AB 2-0 SH 18 (SUTURE) ×3 IMPLANT
SUT VIC AB 2-0 SH 27 (SUTURE) ×2
SUT VIC AB 2-0 SH 27X BRD (SUTURE) ×1 IMPLANT
SUT VIC AB 3-0 SH 18 (SUTURE) IMPLANT
SUT VIC AB 4-0 PS2 18 (SUTURE) ×6 IMPLANT
SUT VIC AB 4-0 PS2 27 (SUTURE) ×6 IMPLANT
SUT VICRYL 0 UR6 27IN ABS (SUTURE) ×3 IMPLANT
SUTURE V-LC BRB 180 2/0GR6GS22 (SUTURE) IMPLANT
SYR 10ML ECCENTRIC (SYRINGE) ×3 IMPLANT
SYS LAPSCP GELPORT 120MM (MISCELLANEOUS)
SYSTEM LAPSCP GELPORT 120MM (MISCELLANEOUS) IMPLANT
TOWEL OR 17X26 10 PK STRL BLUE (TOWEL DISPOSABLE) ×3 IMPLANT
TOWEL OR NON WOVEN STRL DISP B (DISPOSABLE) ×3 IMPLANT
TRAY FOLEY MTR SLVR 16FR STAT (SET/KITS/TRAYS/PACK) ×3 IMPLANT
TROCAR ADV FIXATION 5X100MM (TROCAR) ×3 IMPLANT
TUBING CONNECTING 10 (TUBING) ×6 IMPLANT
TUBING CONNECTING 10' (TUBING) ×3
TUBING INSUFFLATION 10FT LAP (TUBING) ×3 IMPLANT
WATER STERILE IRR 3000ML UROMA (IV SOLUTION) ×6 IMPLANT

## 2018-03-12 NOTE — Transfer of Care (Signed)
Immediate Anesthesia Transfer of Care Note  Patient: Marcelyn Ditty  Procedure(s) Performed: XI ROBOT LOW ANTERIOR RESECTION (N/A ) CYSTOSCOPY, BILATERAL PYELOGRAM, INSERTION OF FIREFLY, LEFT TEMPORARY STENT PLACEMENT (N/A )  Patient Location: PACU  Anesthesia Type:General  Level of Consciousness: awake, alert  and oriented  Airway & Oxygen Therapy: Patient Spontanous Breathing and Patient connected to face mask oxygen  Post-op Assessment: Report given to RN and Post -op Vital signs reviewed and stable  Post vital signs: Reviewed  Last Vitals:  Vitals Value Taken Time  BP 139/75 03/12/2018 11:40 AM  Temp    Pulse 78 03/12/2018 11:42 AM  Resp 15 03/12/2018 11:42 AM  SpO2 99 % 03/12/2018 11:42 AM  Vitals shown include unvalidated device data.  Last Pain:  Vitals:   03/12/18 0603  TempSrc:   PainSc: 0-No pain         Complications: No apparent anesthesia complications

## 2018-03-12 NOTE — Anesthesia Procedure Notes (Signed)
Procedure Name: Intubation Performed by: Raylynne Cubbage J, CRNA Pre-anesthesia Checklist: Patient identified, Emergency Drugs available, Suction available, Patient being monitored and Timeout performed Patient Re-evaluated:Patient Re-evaluated prior to induction Oxygen Delivery Method: Circle system utilized Preoxygenation: Pre-oxygenation with 100% oxygen Induction Type: IV induction Ventilation: Mask ventilation without difficulty Laryngoscope Size: Mac and 4 Grade View: Grade II Tube type: Oral Tube size: 7.0 mm Number of attempts: 1 Airway Equipment and Method: Stylet Placement Confirmation: ETT inserted through vocal cords under direct vision,  positive ETCO2 and breath sounds checked- equal and bilateral Secured at: 21 cm Tube secured with: Tape Dental Injury: Teeth and Oropharynx as per pre-operative assessment        

## 2018-03-12 NOTE — H&P (Signed)
The patient is a 78 year old female who presents with colovaginal fistula. 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 Patient has a long-standing history of diverticular disease with her first episode requiring hospitalization. Her last episode was approximately 5 years ago. This was treated with antibiotics. Over the past few months she has developed a brown vaginal discharge which is worse after bowel movements. She underwent a CT scan which showed a pericolonic abscess, left hydronephrosis and a fistula tract to the left side of the vaginal cuff. She is status post hysterectomy. She has no other surgical history. Hysterectomy was for uterine cancer. This was done robotically.   Past Surgical History (Tanisha A. Owens Shark, [place]; 01/01/2018 10:04 AM) Breast Biopsy  Left. Cataract Surgery  Right. Gallbladder Surgery - Laparoscopic  Hysterectomy (due to cancer) - Complete  Knee Surgery  Left. Tonsillectomy   Diagnostic Studies History (Tanisha A. Owens Shark, [place]; 01/01/2018 10:04 AM) Colonoscopy  1-5 years ago Mammogram  within last year Pap Smear  1-5 years ago  Allergies (Tanisha A. Owens Shark, [place]; 01/01/2018 10:05 AM) Sulfa Antibiotics  Allergies Reconciled   Medication History (Tanisha A. Owens Shark, [place]; 01/01/2018 10:06 AM) Toprol XL (25MG  Tablet ER 24HR, Oral) Active. Triamterene-HCTZ (37.5-25MG  Tablet, Oral) Active. Medications Reconciled  Social History (Tanisha A. Owens Shark, [place]; 01/01/2018 10:04 AM) Alcohol use  Occasional alcohol use. Caffeine use  Carbonated beverages, Coffee, Tea. No drug use  Tobacco use  Never smoker.  Family History (Tanisha A. Owens Shark, [place]; 01/01/2018 10:04 AM) Diabetes Mellitus  Father, Mother. Ischemic Bowel Disease  Mother.  Pregnancy / Birth History (Tanisha A. Owens Shark, [place]; 01/01/2018 10:04 AM) Age at menarche  88 years. Age of menopause  51-55 Contraceptive History  Intrauterine device, Oral  contraceptives. Gravida  2 Length (months) of breastfeeding  3-6 Maternal age  66-25 Para  2 Regular periods   Other Problems (Tanisha A. Owens Shark, [place]; 01/01/2018 10:04 AM) Back Pain  Cholelithiasis  Diverticulosis  High blood pressure  Oophorectomy     Review of Systems  General Not Present- Appetite Loss, Chills, Fatigue, Fever, Night Sweats, Weight Gain and Weight Loss. Skin Not Present- Change in Wart/Mole, Dryness, Hives, Jaundice, New Lesions, Non-Healing Wounds, Rash and Ulcer. HEENT Present- Wears glasses/contact lenses. Not Present- Earache, Hearing Loss, Hoarseness, Nose Bleed, Oral Ulcers, Ringing in the Ears, Seasonal Allergies, Sinus Pain, Sore Throat, Visual Disturbances and Yellow Eyes. Respiratory Not Present- Bloody sputum, Chronic Cough, Difficulty Breathing, Snoring and Wheezing. Breast Not Present- Breast Mass, Breast Pain, Nipple Discharge and Skin Changes. Cardiovascular Not Present- Chest Pain, Difficulty Breathing Lying Down, Leg Cramps, Palpitations, Rapid Heart Rate, Shortness of Breath and Swelling of Extremities. Gastrointestinal Present- Constipation. Not Present- Abdominal Pain, Bloating, Bloody Stool, Change in Bowel Habits, Chronic diarrhea, Difficulty Swallowing, Excessive gas, Gets full quickly at meals, Hemorrhoids, Indigestion, Nausea, Rectal Pain and Vomiting. Female Genitourinary Not Present- Frequency, Nocturia, Painful Urination, Pelvic Pain and Urgency. Musculoskeletal Present- Back Pain. Not Present- Joint Pain, Joint Stiffness, Muscle Pain, Muscle Weakness and Swelling of Extremities. Neurological Not Present- Decreased Memory, Fainting, Headaches, Numbness, Seizures, Tingling, Tremor, Trouble walking and Weakness. Psychiatric Not Present- Anxiety, Bipolar, Change in Sleep Pattern, Depression, Fearful and Frequent crying. Endocrine Not Present- Cold Intolerance, Excessive Hunger, Hair Changes, Heat Intolerance, Hot flashes and New  Diabetes. Hematology Not Present- Blood Thinners, Easy Bruising, Excessive bleeding, Gland problems, HIV and Persistent Infections.  BP (!) 144/87   Pulse 100   Temp 97.9 F (36.6 C) (Oral)   Resp  18   Ht 5\' 1"  (1.549 m)   Wt 82.1 kg   SpO2 97%   BMI 34.20 kg/m     Physical Exam  General Mental Status-Alert. General Appearance-Not in acute distress. Build & Nutrition-Well nourished. Posture-Normal posture. Gait-Normal.  Head and Neck Head-normocephalic, atraumatic with no lesions or palpable masses. Trachea-midline.  Chest and Lung Exam Chest and lung exam reveals -on auscultation, normal breath sounds, no adventitious sounds and normal vocal resonance.  Cardiovascular Cardiovascular examination reveals -normal heart sounds, regular rate and rhythm with no murmurs and no digital clubbing, cyanosis, edema, increased warmth or tenderness.  Abdomen Inspection Inspection of the abdomen reveals - No Hernias. Palpation/Percussion Palpation and Percussion of the abdomen reveal - Soft, Non Tender, No Rigidity (guarding), No hepatosplenomegaly and No Palpable abdominal masses.  Neurologic Neurologic evaluation reveals -alert and oriented x 3 with no impairment of recent or remote memory, normal attention span and ability to concentrate, normal sensation and normal coordination.  Musculoskeletal Normal Exam - Bilateral-Upper Extremity Strength Normal and Lower Extremity Strength Normal.    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 On CT exam she has a small pericolonic abscess causing hydronephrosis on the left side. I have recommended segmental colectomy. We will need urology to inject firefly during the procedure to help identify and preserve the ureters. I may also need urology to help dissect  out the ureter if it appears to be intimately associated with the colon. We will get her colonoscopy records from Dr. Oletta Lamas and then proceed with scheduling her robotic procedure. The surgery and anatomy were described to the patient as well as the risks of surgery and the possible complications. These include: Bleeding, deep abdominal infections and possible wound complications such as hernia and infection, damage to adjacent structures, leak of surgical connections, which can lead to other surgeries and possibly an ostomy, possible need for other procedures, such as abscess drains in radiology, possible prolonged hospital stay, possible diarrhea from removal of part of the colon, possible constipation from narcotics, possible bowel, bladder or sexual dysfunction if having rectal surgery, prolonged fatigue/weakness or appetite loss, possible early recurrence of of disease, possible complications of their medical problems such as heart disease or arrhythmias or lung problems, death (less than 1%). I believe the patient understands and wishes to proceed with the surgery. Current Plans

## 2018-03-12 NOTE — Op Note (Signed)
Preoperative diagnosis:  1. Sigmoid abscess with colovaginal fistula 2. Left hydronephrosis  Postoperative diagnosis:  1. Same  Procedure: 1. Cystoscopy, bilateral retrograde pyelogram with interpretation 2. Instillation of firefly, retrograde, bilateral 3. Insertion of left ureteral stent, temporarily indwelling  Surgeon: Ardis Hughs, MD  Anesthesia: General  Complications: None  Intraoperative findings:  #1: The patient had some erythema and bullous edema in the left posterior bladder wall and trigonal region.  The left ureter was difficult to identify because of the edema. #2: The right retrograde pyelogram demonstrated normal caliber ureter with no ureteral or hydronephrosis.  There is no filling defects. #3: The left retrograde pyelogram demonstrated a narrow irregular ureter at the distal aspect approximately 5 cm from the UVJ.  There was mild hydroureteronephrosis proximal to this.  EBL: Minimal  Specimens: None  Indication: Joanna Hays is a 78 y.o. patient with a colovaginal fistula secondary to a sigmoid abscess.  After reviewing the management options for treatment, he elected to proceed with the above surgical procedure(s). We have discussed the potential benefits and risks of the procedure, side effects of the proposed treatment, the likelihood of the patient achieving the goals of the procedure, and any potential problems that might occur during the procedure or recuperation. Informed consent has been obtained.  Description of procedure:  The patient was taken to the operating room and general anesthesia was induced.  The patient was placed in the dorsal lithotomy position, prepped and draped in the usual sterile fashion, and preoperative antibiotics were administered. A preoperative time-out was performed.   21 French 30 degrees cystoscope was gently passed through the patient's urethra and into the bladder under visual guidance.  Cystoscopy was then  performed with the above findings.  I then performed a right retrograde pyelogram with the above findings using 10 cc of Omnipaque contrast.  I then needed the Alvarado bridge to find the patient's left ureter which ultimately cannulated with a 5 [place] open-ended catheter.  I was able to then perform a retrograde pyelogram with the above findings.  I then instilled 5 cc of firefly to the patient's right ureter through the 5 [place] open-ended catheter.  I then instilled 5 cc of firefly contrast in the patient's left ureter and then under fluoroscopic guidance advanced the open-ended catheter up to the renal pelvis.  I slowly backed the scope out over the catheter leaving the catheter in place.  I then inserted a 16 French Foley catheter and using the [place] adapter was able to connect the stent into the patient's catheter.  Stent was secured to the Foley with a suture.  The patient was then repositioned and draped for the second aspect of the surgery, please see Dr. Biagio Borg dictation for further details. Ardis Hughs, M.D.

## 2018-03-12 NOTE — Op Note (Signed)
03/12/2018  11:18 AM  PATIENT:  Joanna Hays  78 y.o. female  Patient Care Team: Lajean Manes, MD as PCP - General (Internal Medicine)  PRE-OPERATIVE DIAGNOSIS:  Colovaginal fistula  POST-OPERATIVE DIAGNOSIS:  Colovaginal fistula  PROCEDURE:   XI ROBOT LOW ANTERIOR RESECTION CYSTOSCOPY, BILATERAL PYELOGRAM, INSERTION OF FIREFLY, LEFT TEMPORARY STENT PLACEMENT   Surgeon(s): Ardis Hughs, MD Leighton Ruff, MD Michael Boston, MD  ASSISTANT: Dr Johney Maine   ANESTHESIA:   local and general  EBL: 174ml  Total I/O In: 1000 [I.V.:1000] Out: 300 [Urine:200; Blood:100]  Delay start of Pharmacological VTE agent (>24hrs) due to surgical blood loss or risk of bleeding:  no  DRAINS: (65F) Jackson-Pratt drain(s) with closed bulb suction in the pelvis   SPECIMEN:  Source of Specimen:  Sigmoid colon    DISPOSITION OF SPECIMEN:  PATHOLOGY  COUNTS:  YES  PLAN OF CARE: Admit to inpatient   PATIENT DISPOSITION:  PACU - hemodynamically stable.  INDICATION:    Pt with L hydronephrosis and colovaginal fistula.  I recommended segmental resection:  The anatomy & physiology of the digestive tract was discussed.  The pathophysiology was discussed.  Natural history risks without surgery was discussed.   I worked to give an overview of the disease and the frequent need to have multispecialty involvement.  I feel the risks of no intervention will lead to serious problems that outweigh the operative risks; therefore, I recommended a partial colectomy to remove the pathology.  Laparoscopic & open techniques were discussed.   Risks such as bleeding, infection, abscess, leak, reoperation, possible ostomy, hernia, heart attack, death, and other risks were discussed.  I noted a good likelihood this will help address the problem.   Goals of post-operative recovery were discussed as well.    The patient expressed understanding & wished to proceed with surgery.  OR FINDINGS:   Patient had  significant inflammation throughout the left pelvic sidewall.   The anastomosis rests 8 cm from the levators by rigid proctoscopy.  DESCRIPTION:   Informed consent was confirmed.  The patient underwent general anaesthesia without difficulty.  The patient was positioned appropriately.  VTE prevention in place.  The patient's abdomen was clipped, prepped, & draped in a sterile fashion.  Surgical timeout confirmed our plan.  The patient was positioned in reverse Trendelenburg.  Abdominal entry was gained using .  Entry was clean.  I induced carbon dioxide insufflation.  An 34mm robotic port was placed in the RUQ.  Camera inspection revealed no injury.  Extra ports were carefully placed under direct laparoscopic visualization.  I laparoscopically reflected the greater omentum and the upper abdomen the small bowel in the upper abdomen. The patient was appropriately positioned and the robot was docked to the patient's left side.  Instruments were placed under direct visualization.    I mobilized the sigmoid colon off of the pelvic sidewall partially.  I then scored the base of peritoneum of the right side of the mesentery of the left colon from the ligament of Treitz to the peritoneal reflection of the mid rectum.  The patient had a viable plane.  I elevated the sigmoid mesentery and enetered into the retro-mesenteric plane. We were able to identify the left ureter with the use of the firefly. We kept those posterior within the retroperitoneum and elevated the left colon mesentery off that. I did isolated IMA pedicle but did not ligate it yet.  I continued distally and got into the avascular plane posterior to the mesorectum.  This allowed me to help mobilize the rectum as well by freeing the mesorectum off the sacrum.  I mobilized the peritoneal coverings towards the peritoneal reflection on both the right and left sides of the rectum.  I could see the right and left ureters and stayed away from them.  I  continued my dissection of the colon off of the left pelvic sidewall, keeping the ureter lateral and the bladder anterior to my dissection.  Once the entire colon was freed, I was able to lift this out of the pelvis.   I skeletonized the inferior mesenteric artery pedicle.  I went down to its takeoff from the aorta.   I isolated the inferior mesenteric vein off of the ligament of Treitz just cephalad to that as well.  After confirming the left ureter was out of the way, I went ahead and ligated the inferior mesenteric artery pedicle with bipolar robotic vessel sealer well above its takeoff from the aorta. We ensured hemostasis. I skeletonized the mesorectum at the junction at the proximal rectum using blunt dissection & bipolar robotic vessel sealer.  I mobilized the left colon in a lateral to medial fashion off the line of Toldt up towards the splenic flexure to ensure good mobilization of the left colon to reach into the pelvis.  Once this was completed the rectosigmoid junction was divided using 2 green load robotic stapler fires.  The portion of the descending colon was identified as viable.  I dissected the mesentery to this level using the robotic vessel sealer.  I then injected 2 mL of firefly intravenously and evaluated for perfusion.  Good perfusion noted up to the level of the dissection.  The rectum appeared well perfused as well.  At this point the robot was undocked and a small Pfannenstiel incision was made from an enlarged 12 mm port site.  An Perry wound protector was placed.  The colon was brought out through this and the proximal site was noted to have a good line of demarcation.  Pursestring device was placed across this and the colon was divided.  A 29 mm EEA anvil was introduced into the colon and the pursestring was tied tightly around this.  This was then placed back into the abdomen.  An anastomosis was created through the distal portion of the rectal stump with a 29 mm EEA stapler.   There was no tension on the anastomosis.  There was no leak when insufflated under irrigation.  The pelvis was irrigated and a 19 [place] Blake drain was placed and brought out through 1 of the right-sided port sites.  This was secured in place with a nylon suture.  The remaining ports were removed.  We then switched to clean gowns, gloves, instruments and drapes.  The peritoneum of the Pfannenstiel incision was closed using a 2-0 Vicryl suture.  Fascia was closed using interrupted #1 Novafil sutures.  The subcutaneous tissue was closed with interrupted 2-0 Vicryl sutures.  The skin was closed using a running 4-0 subcuticular Vicryl suture.  A sterile dressing was applied.  The remaining port sites were closed using interrupted 4-0 Vicryl sutures and Dermabond.  The patient was then awakened from anesthesia and sent to the postanesthesia care unit in stable condition.  All counts were correct per operating room staff.  An MD assistant was necessary for tissue manipulation, retraction and positioning due to the complexity of the case and hospital policies

## 2018-03-12 NOTE — Anesthesia Postprocedure Evaluation (Signed)
Anesthesia Post Note  Patient: Joanna Hays  Procedure(s) Performed: XI ROBOT LOW ANTERIOR RESECTION (N/A ) CYSTOSCOPY, BILATERAL PYELOGRAM, INSERTION OF FIREFLY, LEFT TEMPORARY STENT PLACEMENT (N/A )     Patient location during evaluation: PACU Anesthesia Type: General Level of consciousness: awake and alert Pain management: pain level controlled Vital Signs Assessment: post-procedure vital signs reviewed and stable Respiratory status: spontaneous breathing, nonlabored ventilation, respiratory function stable and patient connected to nasal cannula oxygen Cardiovascular status: blood pressure returned to baseline and stable Postop Assessment: no apparent nausea or vomiting Anesthetic complications: no    Last Vitals:  Vitals:   03/12/18 1445 03/12/18 1513  BP: 126/76 133/81  Pulse: 91 92  Resp: 15 16  Temp: 36.5 C (!) 36.4 C  SpO2: 94% 94%    Last Pain:  Vitals:   03/12/18 1513  TempSrc: Oral  PainSc:                  Belia Febo P Md Sm[place]

## 2018-03-13 ENCOUNTER — Encounter (HOSPITAL_COMMUNITY): Payer: Self-pay | Admitting: General Surgery

## 2018-03-13 LAB — BASIC METABOLIC PANEL
Anion gap: 8 (ref 5–15)
BUN: 15 mg/dL (ref 8–23)
CO2: 25 mmol/L (ref 22–32)
Calcium: 9.1 mg/dL (ref 8.9–10.3)
Chloride: 105 mmol/L (ref 98–111)
Creatinine, Ser: 0.82 mg/dL (ref 0.44–1.00)
GFR calc Af Amer: >60 mL/min (ref >60)
GFR calc non Af Amer: >60 mL/min (ref >60)
Glucose, Bld: 168 mg/dL — ABNORMAL HIGH (ref 70–99)
POTASSIUM: 4 mmol/L (ref 3.5–5.1)
Sodium: 138 mmol/L (ref 135–145)

## 2018-03-13 LAB — CBC
HCT: 40.6 % (ref 36.0–46.0)
Hemoglobin: 13.1 g/dL (ref 12.0–15.0)
MCH: 28.4 pg (ref 26.0–34.0)
MCHC: 32.3 g/dL (ref 30.0–36.0)
MCV: 87.9 fL (ref 80.0–100.0)
NRBC: 0 % (ref 0.0–0.2)
Platelets: 264 10*3/uL (ref 150–400)
RBC: 4.62 MIL/uL (ref 3.87–5.11)
RDW: 14.1 % (ref 11.5–15.5)
WBC: 18 10*3/uL — ABNORMAL HIGH (ref 4.0–10.5)

## 2018-03-13 NOTE — Progress Notes (Signed)
1 Day Post-Op   Subjective/Chief Complaint: Pt's main complaint is bladder spasms.  She denies nausea or vomiting.  She had a bm this AM.     Objective: Vital signs in last 24 hours: Temp:  [95.3 F (35.2 C)-98.7 F (37.1 C)] 98.7 F (37.1 C) (01/25 0934) Pulse Rate:  [65-96] 66 (01/25 0934) Resp:  [10-18] 18 (01/25 0934) BP: (108-140)/(67-84) 122/76 (01/25 0934) SpO2:  [90 %-100 %] 98 % (01/25 0934) Last BM Date: 03/11/18  Intake/Output from previous day: 01/24 0701 - 01/25 0700 In: 3277.4 [P.O.:840; I.V.:2437.4] Out: 6811 [Urine:2525; Drains:852; Blood:100] Intake/Output this shift: No intake/output data recorded.  General appearance: alert and no distress Resp: breathing comfortalby GI- soft, mild distention.  Wounds c/d/i.   Ext - warm, well perfused.   Lab Results:  Recent Labs    03/13/18 0415  WBC 18.0*  HGB 13.1  HCT 40.6  PLT 264   BMET Recent Labs    03/13/18 0415  NA 138  K 4.0  CL 105  CO2 25  GLUCOSE 168*  BUN 15  CREATININE 0.82  CALCIUM 9.1   PT/INR No results for input(s): LABPROT, INR in the last 72 hours. ABG No results for input(s): PHART, HCO3 in the last 72 hours.  Invalid input(s): PCO2, PO2  Studies/Results: Dg C-arm 1-60 Min-no Report  Result Date: 03/12/2018 Fluoroscopy was utilized by the requesting physician.  No radiographic interpretation.    Anti-infectives: Anti-infectives (From admission, onward)   Start     Dose/Rate Route Frequency Ordered Stop   03/12/18 2000  cefoTEtan (CEFOTAN) 2 g in sodium chloride 0.9 % 100 mL IVPB     2 g 200 mL/hr over 30 Minutes Intravenous Every 12 hours 03/12/18 1505 03/12/18 2041   03/12/18 0600  cefoTEtan (CEFOTAN) 2 g in sodium chloride 0.9 % 100 mL IVPB     2 g 200 mL/hr over 30 Minutes Intravenous On call to O.R. 03/12/18 5726 03/12/18 2035      Assessment/Plan: s/p Procedure(s): XI ROBOT LOW ANTERIOR RESECTION (N/A) CYSTOSCOPY, BILATERAL PYELOGRAM, INSERTION OF FIREFLY,  LEFT TEMPORARY STENT PLACEMENT (N/A) d/c foley Advance diet  Supplements. Pt ambulating well.    LOS: 1 day    Stark Klein 03/13/2018

## 2018-03-13 NOTE — Progress Notes (Signed)
I have reviewed and concur with this student's documentation.   

## 2018-03-13 NOTE — Plan of Care (Signed)
Patient sitting up in chair; foley in place. Abdomen tender. No complaints of pain currently. Will continue to monitor.

## 2018-03-13 NOTE — Progress Notes (Signed)
Pharmacy Brief Note - Alvimopan (Entereg)  The standing order set for alvimopan (Entereg) now includes an automatic order to discontinue the drug after the patient has had a bowel movement.  The change was approved by the [place] and the Medical Executive Committee.    This patient has had a bowel movement documented by nursing.  Therefore, alvimopan has been discontinued.  If there are questions, please contact the pharmacy at [phone].  Thank you  Gretta Arab PharmD, BCPS Pager [phone] 03/13/2018 10:39 AM

## 2018-03-14 LAB — BASIC METABOLIC PANEL
Anion gap: 6 (ref 5–15)
BUN: 13 mg/dL (ref 8–23)
CO2: 30 mmol/L (ref 22–32)
CREATININE: 0.78 mg/dL (ref 0.44–1.00)
Calcium: 8.9 mg/dL (ref 8.9–10.3)
Chloride: 103 mmol/L (ref 98–111)
GFR calc Af Amer: >60 mL/min (ref >60)
GFR calc non Af Amer: >60 mL/min (ref >60)
Glucose, Bld: 123 mg/dL — ABNORMAL HIGH (ref 70–99)
Potassium: 4 mmol/L (ref 3.5–5.1)
Sodium: 139 mmol/L (ref 135–145)

## 2018-03-14 LAB — CBC
HCT: 38 % (ref 36.0–46.0)
Hemoglobin: 12.3 g/dL (ref 12.0–15.0)
MCH: 28.5 pg (ref 26.0–34.0)
MCHC: 32.4 g/dL (ref 30.0–36.0)
MCV: 88 fL (ref 80.0–100.0)
Platelets: 243 10*3/uL (ref 150–400)
RBC: 4.32 MIL/uL (ref 3.87–5.11)
RDW: 14.4 % (ref 11.5–15.5)
WBC: 12.7 10*3/uL — AB (ref 4.0–10.5)
nRBC: 0 % (ref 0.0–0.2)

## 2018-03-14 LAB — C DIFFICILE QUICK SCREEN W PCR REFLEX
C Diff antigen: NEGATIVE
C Diff interpretation: NOT DETECTED
C Diff toxin: NEGATIVE

## 2018-03-14 NOTE — Progress Notes (Signed)
2 Days Post-Op   Subjective/Chief Complaint: Pt had 9 liquids BMs in last 24 hours with 6 in the last 5 hours.  No other complaints.  Bladder spasms resolved after foley removal.  Has been urinating OK.    Objective: Vital signs in last 24 hours: Temp:  [98 F (36.7 C)-98.7 F (37.1 C)] 98.3 F (36.8 C) (01/26 0651) Pulse Rate:  [63-75] 75 (01/26 0651) Resp:  [17-18] 18 (01/26 0651) BP: (122-138)/(68-76) 130/71 (01/26 0651) SpO2:  [94 %-98 %] 97 % (01/26 0651) Weight:  [82.7 kg] 82.7 kg (01/26 0651) Last BM Date: 03/13/18  Intake/Output from previous day: 01/25 0701 - 01/26 0700 In: 1390.8 [P.O.:1260; I.V.:130.8] Out: 725 [Urine:625; Drains:100] Intake/Output this shift: No intake/output data recorded.  General appearance: alert and no distress Resp: breathing comfortably GI- soft, nondistended.  Wounds c/d/i.   Ext - warm, well perfused.   Lab Results:  Recent Labs    03/13/18 0415 03/14/18 0401  WBC 18.0* 12.7*  HGB 13.1 12.3  HCT 40.6 38.0  PLT 264 243   BMET Recent Labs    03/13/18 0415 03/14/18 0401  NA 138 139  K 4.0 4.0  CL 105 103  CO2 25 30  GLUCOSE 168* 123*  BUN 15 13  CREATININE 0.82 0.78  CALCIUM 9.1 8.9   PT/INR No results for input(s): LABPROT, INR in the last 72 hours. ABG No results for input(s): PHART, HCO3 in the last 72 hours.  Invalid input(s): PCO2, PO2  Studies/Results: Dg C-arm 1-60 Min-no Report  Result Date: 03/12/2018 Fluoroscopy was utilized by the requesting physician.  No radiographic interpretation.    Anti-infectives: Anti-infectives (From admission, onward)   Start     Dose/Rate Route Frequency Ordered Stop   03/12/18 2000  cefoTEtan (CEFOTAN) 2 g in sodium chloride 0.9 % 100 mL IVPB     2 g 200 mL/hr over 30 Minutes Intravenous Every 12 hours 03/12/18 1505 03/12/18 2041   03/12/18 0600  cefoTEtan (CEFOTAN) 2 g in sodium chloride 0.9 % 100 mL IVPB     2 g 200 mL/hr over 30 Minutes Intravenous On call to O.R.  03/12/18 [id number] 03/12/18 0254      Assessment/Plan: s/p Procedure(s): XI ROBOT LOW ANTERIOR RESECTION (N/A) CYSTOSCOPY, BILATERAL PYELOGRAM, INSERTION OF FIREFLY, LEFT TEMPORARY STENT PLACEMENT (N/A)  Advance diet to regular Supplements. Pt ambulating well.   Loose stools likely related to no solid food and post surgical with removal of diseased colon, but will check for c diff given number of stools.    Hopefully stool consistency will improve after diet advance.   Will not d/c today given diarrhea.      LOS: 2 days    Stark Klein 03/14/2018

## 2018-03-15 LAB — BASIC METABOLIC PANEL
Anion gap: 7 (ref 5–15)
BUN: 15 mg/dL (ref 8–23)
CO2: 26 mmol/L (ref 22–32)
CREATININE: 0.73 mg/dL (ref 0.44–1.00)
Calcium: 8.7 mg/dL — ABNORMAL LOW (ref 8.9–10.3)
Chloride: 104 mmol/L (ref 98–111)
GFR calc non Af Amer: >60 mL/min (ref >60)
Glucose, Bld: 142 mg/dL — ABNORMAL HIGH (ref 70–99)
Potassium: 3.6 mmol/L (ref 3.5–5.1)
SODIUM: 137 mmol/L (ref 135–145)

## 2018-03-15 LAB — CBC
HCT: 39.4 % (ref 36.0–46.0)
Hemoglobin: 12.6 g/dL (ref 12.0–15.0)
MCH: 28.4 pg (ref 26.0–34.0)
MCHC: 32 g/dL (ref 30.0–36.0)
MCV: 88.7 fL (ref 80.0–100.0)
Platelets: 260 10*3/uL (ref 150–400)
RBC: 4.44 MIL/uL (ref 3.87–5.11)
RDW: 14.5 % (ref 11.5–15.5)
WBC: 13 10*3/uL — AB (ref 4.0–10.5)
nRBC: 0 % (ref 0.0–0.2)

## 2018-03-15 MED ORDER — ACETAMINOPHEN 500 MG PO TABS: 1000.0000 mg | ORAL_TABLET | Freq: Four times a day (QID) | ORAL | 0 refills | Status: AC | PRN

## 2018-03-15 NOTE — Discharge Summary (Signed)
Physician Discharge Summary  Patient ID: SAHIBA GRANHOLM MRN: [id number] DOB/AGE: May 21, 1940 78 y.o.  Admit date: 03/12/2018 Discharge date: 03/15/2018  Admission Diagnoses: Colovaginal fistula, L hydronephrosis  Discharge Diagnoses:  Active Problems:   Colovaginal fistula L hydronephrosis  Discharged Condition: good  Hospital Course: Pt admitted after surgery.  Her diet was advanced as tolerated.  By POD 3 she was ambulating well, tolerating a diet and having bowel function.  Her pain was controlled with Tylenol.  She was stable for d/c  Consults: None  Significant Diagnostic Studies: labs: cbc, bmet and c diff(neg)  Treatments: IV hydration, analgesia: acetaminophen and surgery: robotic LAR  Discharge Exam: Blood pressure 120/76, pulse 86, temperature 98.1 F (36.7 C), temperature source Oral, resp. rate 16, height 5\' 1"  (1.549 m), weight 82.7 kg, SpO2 93 %. General appearance: alert and cooperative GI: normal findings: soft, non-tender Incision/Wound: clean, dry, intact  Disposition: home    Follow-up Information    Leighton Ruff, MD. Schedule an appointment as soon as possible for a visit in 2 week(s).   Specialty:  General Surgery Contact information: [address] [place] [address] [phone]           Signed: Rosario Adie 6/81/1572, 8:20 AM

## 2018-03-15 NOTE — Care Management Note (Signed)
Case Management Note  Patient Details  Name: Joanna Hays MRN: [id number] Date of Birth: 08/21/1940  Subjective/Objective:                  Discharged   Action/Plan: Discharged to home with self-care Orders checked for hhc needs. No CM needs present at time of discharge.  Expected Discharge Date:  03/15/18               Expected Discharge Plan:  Home/Self Care  In-House Referral:     Discharge planning Services  CM Consult  Post Acute Care Choice:    Choice offered to:     DME Arranged:    DME Agency:     HH Arranged:    HH Agency:     Status of Service:  Completed, signed off  If discussed at [employer] of Stay Meetings, dates discussed:    Additional Comments:  Leeroy Cha, RN 03/15/2018, 10:31 AM

## 2018-03-15 NOTE — Progress Notes (Signed)
Writer explained d/c instructions to patient and family. No questions. D/C JP drain. NT will wheel patient out to front

## 2018-03-15 NOTE — Discharge Instructions (Signed)

## 2018-03-15 NOTE — Care Management Important Message (Signed)
Important Message  Patient Details  Name: BRIGITTE SODERBERG MRN: [id number] Date of Birth: [birth year]   Medicare Important Message Given:  Yes    Freada, Tw[place] 03/15/2018, 10:00 AMImportant Message  Patient Details  Name: DACHELLE MOLZAHN MRN: [id number] Date of Birth: 1940/06/18   Medicare Important Message Given:  Yes    Stephaney, Steven 03/15/2018, 9:59 AM

## 2018-05-01 IMAGING — CR DG CHEST 2V
2 series · 2 of 2 positions shown · non-contrast
Comparison: July 19, 2010

CLINICAL DATA: Partial knee replacement.  Postop film.

EXAM:
CHEST  2 VIEW

[chest lat]
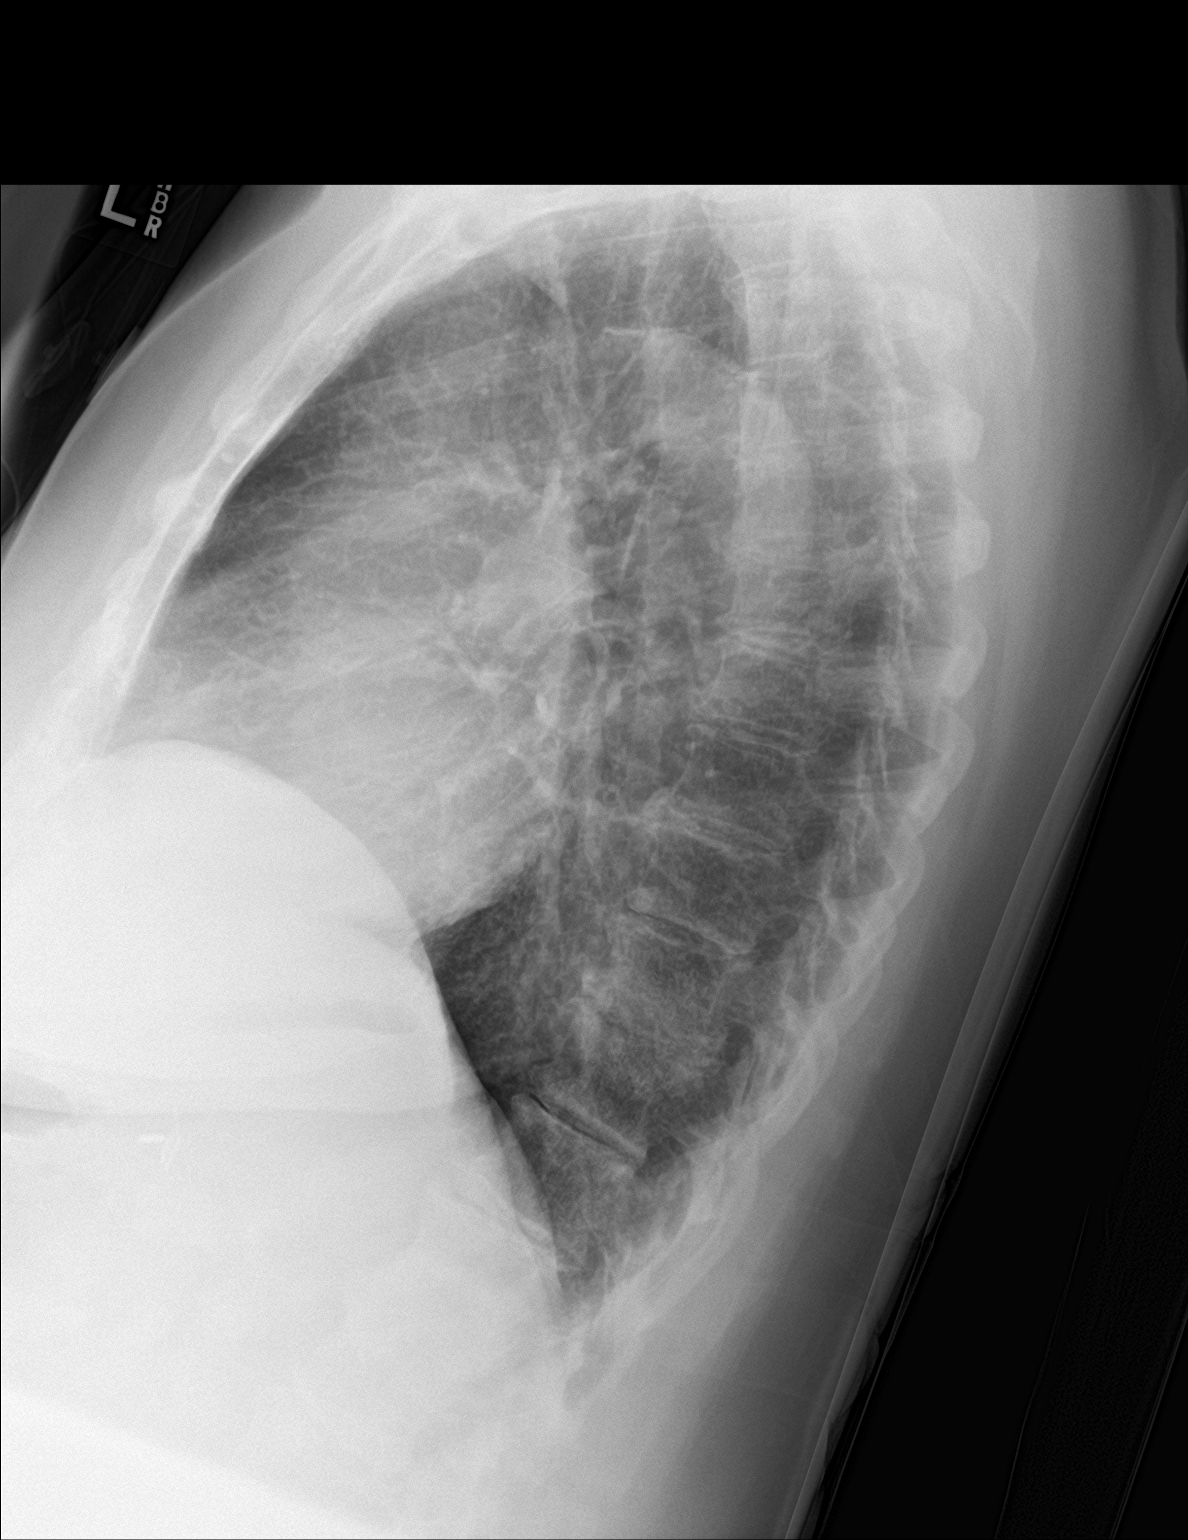

[chest ap]
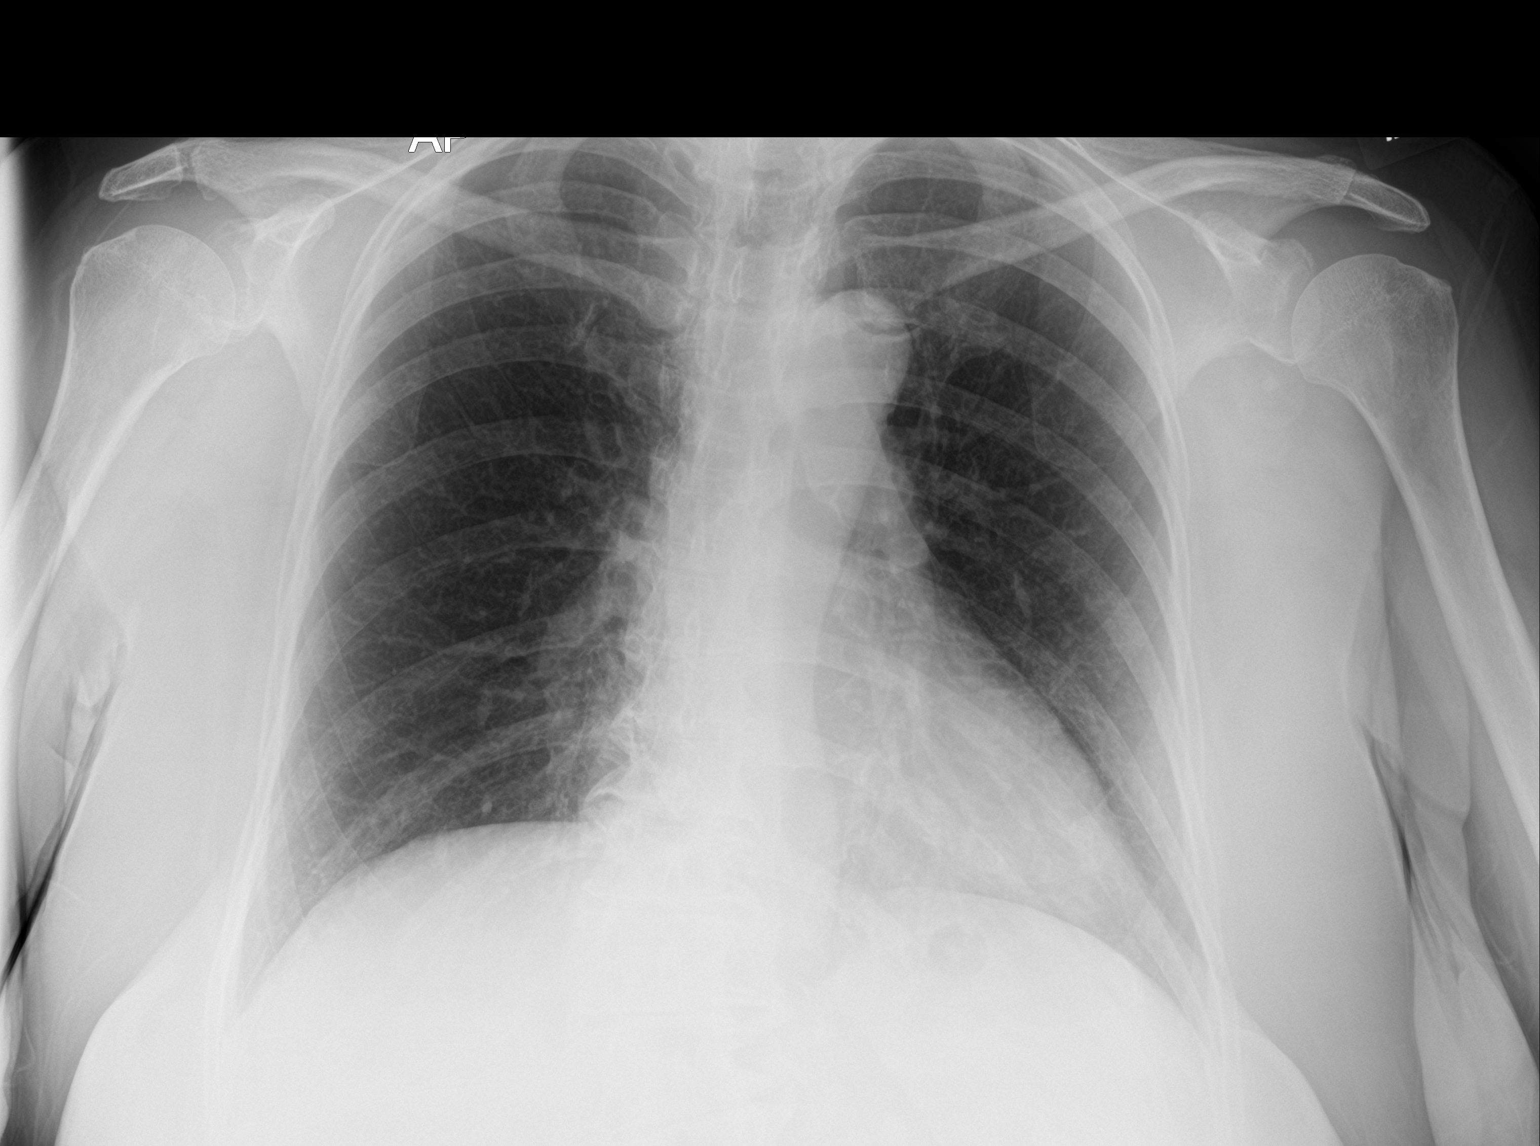

[2 of 2 positions shown; findings below may reference images not displayed]

FINDINGS: The heart size and mediastinal contours are within normal limits.
Both lungs are clear. The visualized skeletal structures are
unremarkable.
IMPRESSION: No active cardiopulmonary disease.

## 2019-02-28 ENCOUNTER — Ambulatory Visit: Payer: Medicare Other | Attending: Internal Medicine

## 2019-02-28 DIAGNOSIS — Z23 Encounter for immunization: Secondary | ICD-10-CM | POA: Insufficient documentation

## 2019-02-28 NOTE — Progress Notes (Signed)
   Covid-19 Vaccination Clinic  Name:  Joanna Hays    MRN: [id number] DOB: 1940/11/26  02/28/2019  [name] was observed post Covid-19 immunization for 15 minutes without incidence. She was provided with Vaccine Information Sheet and instruction to access the V-Safe system.   [name] was instructed to call 911 with any severe reactions post vaccine: Marland Kitchen Difficulty breathing  . Swelling of your face and throat  . A fast heartbeat  . A bad rash all over your body  . Dizziness and weakness    Immunizations Administered    Name Date Dose VIS Date Route   Pfizer COVID-19 Vaccine 02/28/2019 12:18 PM 0.3 mL 01/28/2019 Intramuscular   Manufacturer: [place]   Lot: [id number]   [place]: [id number]

## 2019-03-01 ENCOUNTER — Ambulatory Visit: Payer: Medicare Other | Attending: Internal Medicine

## 2019-03-15 IMAGING — CT CT ABD-PELV W/ CM
1 of 3 series · 11 of 32 positions shown, 17 images · IV contrast (iopamidol)
Comparison: 03/04/2011

CLINICAL DATA: History of endometrial carcinoma. Postmenopausal
bleeding. Stool per vagina.

EXAM:
CT ABDOMEN AND PELVIS WITH CONTRAST
TECHNIQUE: Multidetector CT imaging of the abdomen and pelvis was performed
using the standard protocol following bolus administration of
intravenous contrast.
CONTRAST:  100mL [id number] IOPAMIDOL ([id number]) INJECTION 61%

[Series 2: abd/pelvis w/cm · axial · 0.81mm/px · z∈[-422,-72]mm · 11 of 84 slices shown, 17 images]
[im 7/84  soft-tissue]
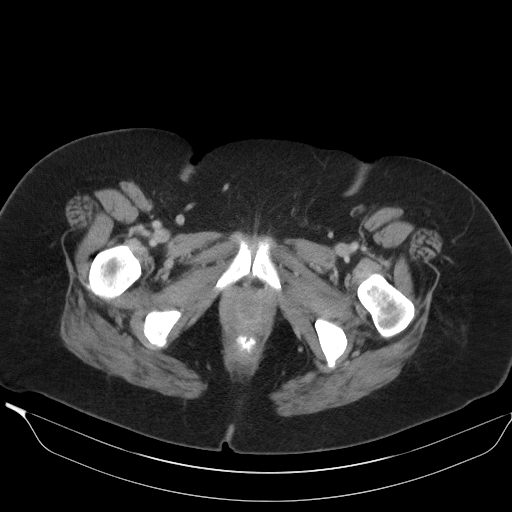
[im 7/84  bone]
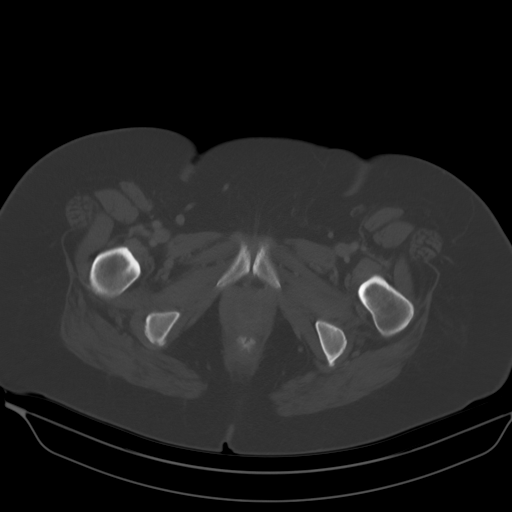
[im 14/84  soft-tissue]
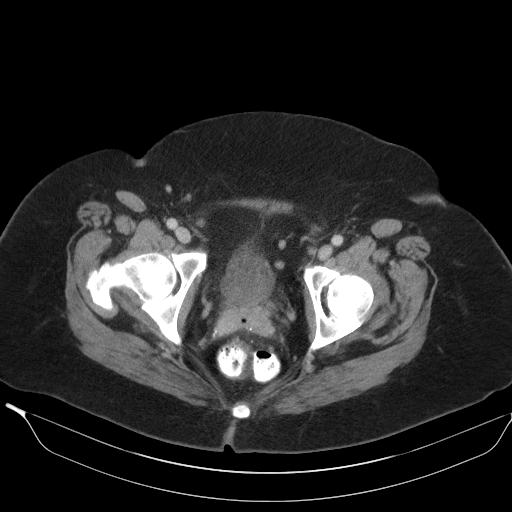
[im 21/84  soft-tissue]
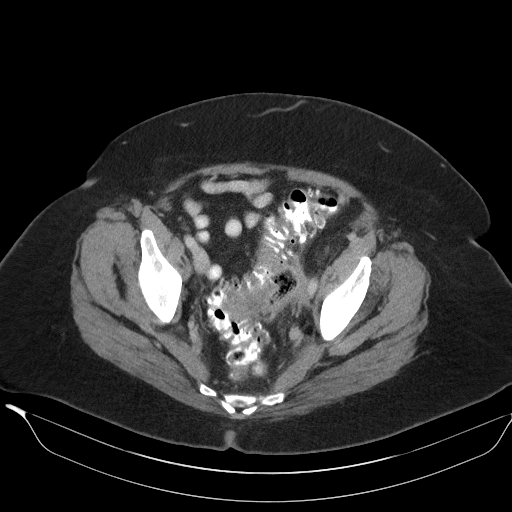
[im 28/84  soft-tissue]
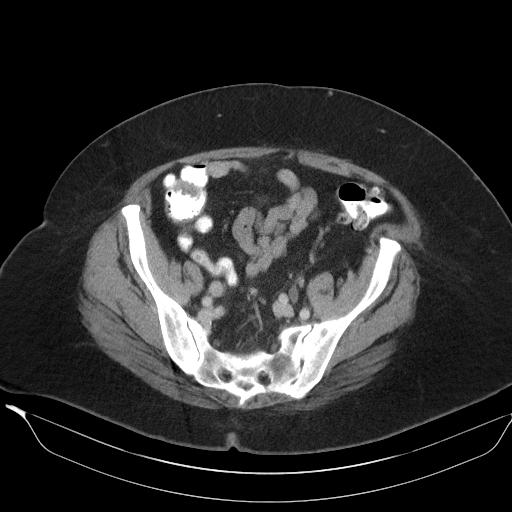
[im 35/84  soft-tissue]
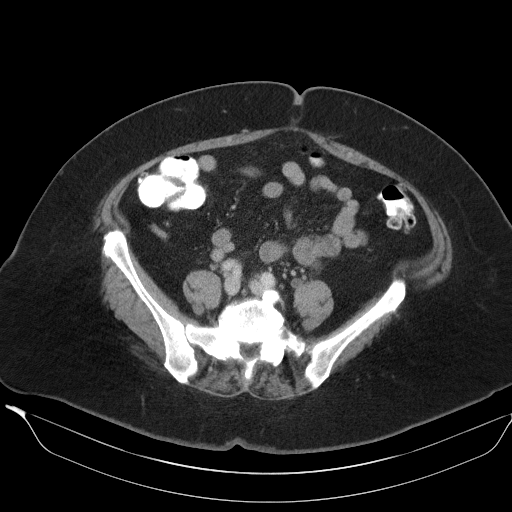
[im 42/84  soft-tissue]
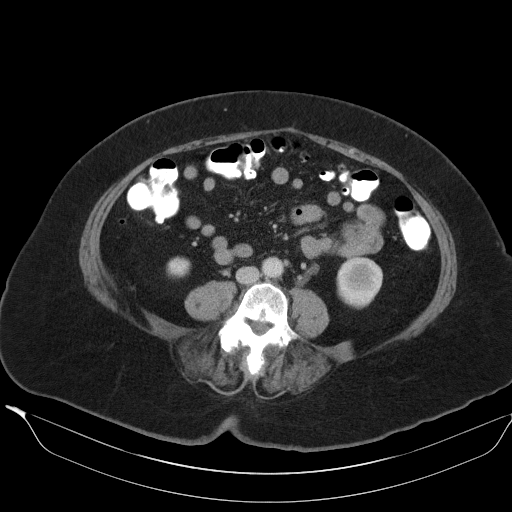
[im 49/84  soft-tissue]
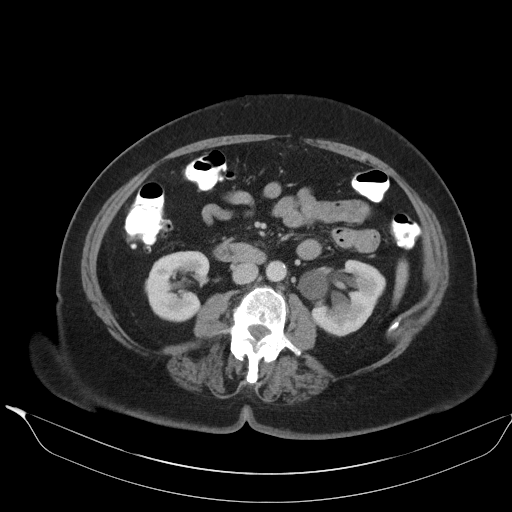
[im 56/84  soft-tissue]
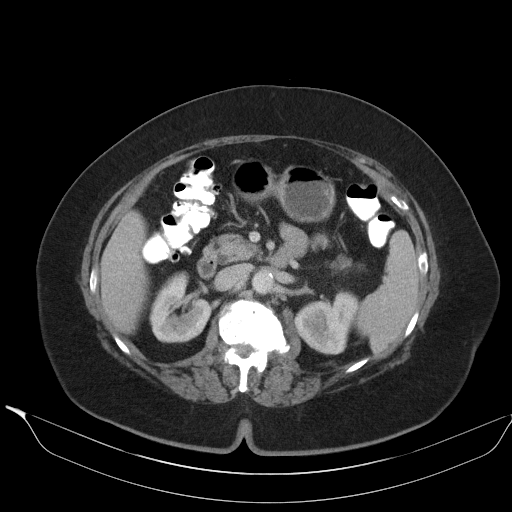
[im 56/84  lung]
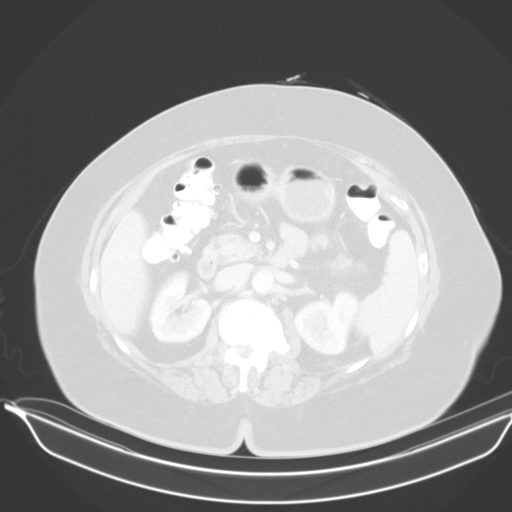
[im 63/84  soft-tissue]
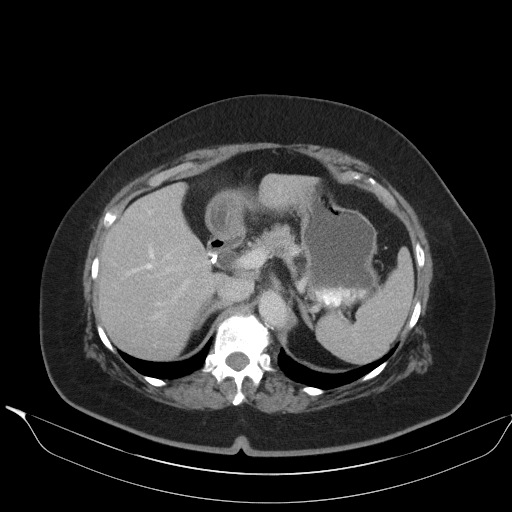
[im 63/84  lung]
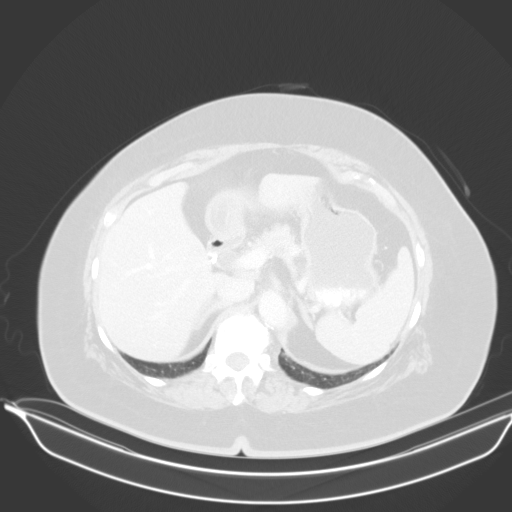
[im 63/84  bone]
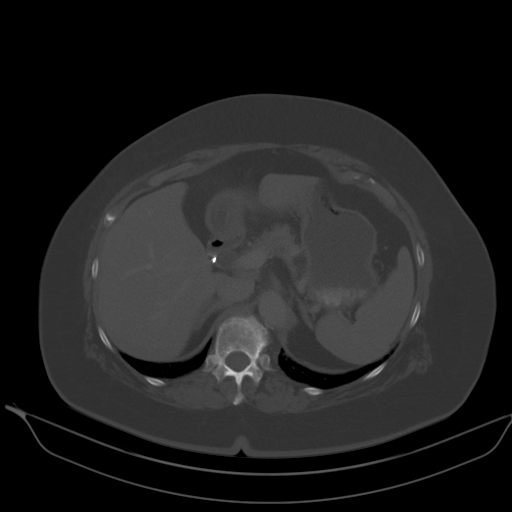
[im 70/84  soft-tissue]
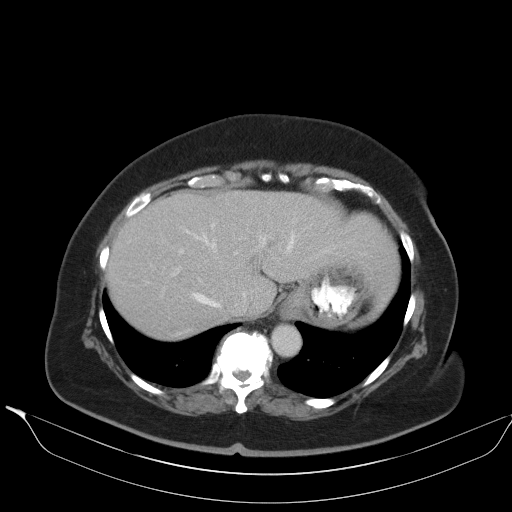
[im 70/84  lung]
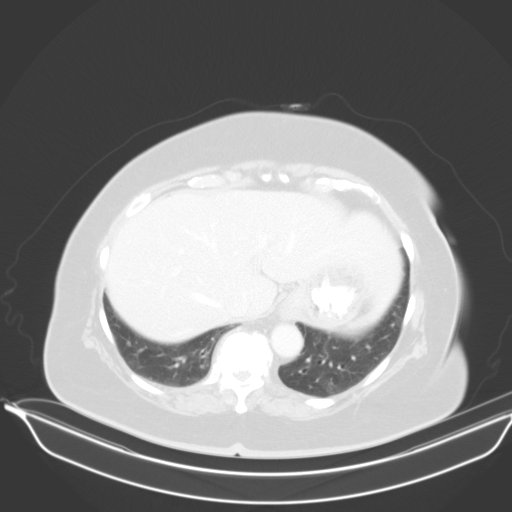
[im 77/84  soft-tissue]
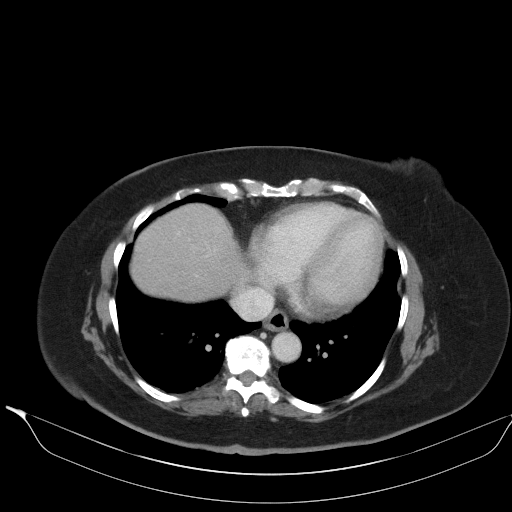
[im 77/84  lung]
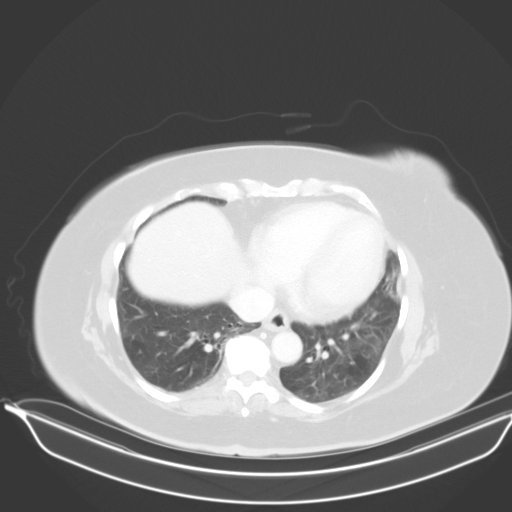

[11 of 32 positions shown; findings below may reference images not displayed]

FINDINGS: Lower chest: Mosaic attenuation in the lung bases suggests air
trapping.

Hepatobiliary: No focal abnormality within the liver parenchyma.
Gallbladder surgically absent. No intrahepatic or extrahepatic
biliary dilation.

Pancreas: No focal mass lesion. No dilatation of the main duct. No
intraparenchymal cyst. No peripancreatic edema.

Spleen: No splenomegaly. No focal mass lesion.

Adrenals/Urinary Tract: No adrenal nodule or mass. Right kidney
unremarkable. Tiny cyst noted lower pole left kidney. Right ureter
unremarkable. There is mild left hydroureteronephrosis with left
ureteral dilation extending down to the left pelvic sidewall.

Stomach/Bowel: Stomach is nondistended. No gastric wall thickening.
No evidence of outlet obstruction. Duodenum is normally positioned
as is the ligament of Treitz. No small bowel wall thickening. No
small bowel dilatation. The terminal ileum is normal. The appendix
is normal. Diffuse diverticular disease is seen along the length of
the colon. Mid to distal sigmoid colon demonstrates wall thickening
and pericolonic edema/inflammation. Image 64/series 2 demonstrates
2.3 x 3.6 x 2.6 cm thick walled collection of gas and debris along
the lateral colonic wall. There is probably some trace contrast
material within this collection (65/2) and the surrounding
inflammatory change-granuloma tracks down into the left vaginal cuff
(67/4)..

Vascular/Lymphatic: There is abdominal aortic atherosclerosis
without aneurysm. There is no gastrohepatic or hepatoduodenal
ligament lymphadenopathy. No intraperitoneal or retroperitoneal
lymphadenopathy. No pelvic sidewall lymphadenopathy.

Reproductive: Uterus surgically absent. Gas bubble noted in the
vagina. There is no adnexal mass.

Other: No intraperitoneal free fluid.

Musculoskeletal: Tiny umbilical hernia contains only fat. No
worrisome lytic or sclerotic osseous abnormality. Thoracolumbar
scoliosis evident. Diffuse degenerative changes evident in the lower
thoracic and lumbar spine.
IMPRESSION: 1. Diffuse colonic diverticulosis with marked wall thickening and
pericolonic inflammation/granulation associated with the mid to
distal sigmoid segment. 3-4 cm extra colonic, thick walled
collection of gas and debris in this region has chronic appearance
and tracks inferiorly to the left vaginal cuff. Given the history of
stool per vagina, imaging features are compatible with colovaginal
fistula, likely secondary to prior diverticulitis.
2. Mild left hydroureteronephrosis with left ureteral distention
extending down to the level of the left pelvic sidewall. Changes
described immediately above likely account for the ureteral
obstruction.
3.  Aortic Atherosclerois (7PTII-170.0)

These results will be called to the ordering clinician or
representative by the Radiologist Assistant, and communication
documented in the PACS or zVision Dashboard.

## 2019-03-21 ENCOUNTER — Ambulatory Visit: Payer: Medicare Other | Attending: Internal Medicine

## 2019-03-21 DIAGNOSIS — Z23 Encounter for immunization: Secondary | ICD-10-CM | POA: Insufficient documentation

## 2019-03-21 NOTE — Progress Notes (Signed)
   Covid-19 Vaccination Clinic  Name:  Joanna Hays    MRN: [id number] DOB: Apr 23, 1940  03/21/2019  [name] was observed post Covid-19 immunization for 15 minutes without incidence. She was provided with Vaccine Information Sheet and instruction to access the V-Safe system.   [name] was instructed to call 911 with any severe reactions post vaccine: Marland Kitchen Difficulty breathing  . Swelling of your face and throat  . A fast heartbeat  . A bad rash all over your body  . Dizziness and weakness    Immunizations Administered    Name Date Dose VIS Date Route   Pfizer COVID-19 Vaccine 03/21/2019  8:36 AM 0.3 mL 01/28/2019 Intramuscular   Manufacturer: [place]   Lot: [id number]   [place]: [id number]

## 2020-02-24 DIAGNOSIS — Z20822 Contact with and (suspected) exposure to covid-19: Secondary | ICD-10-CM | POA: Diagnosis not present

## 2020-05-17 DIAGNOSIS — Z20822 Contact with and (suspected) exposure to covid-19: Secondary | ICD-10-CM | POA: Diagnosis not present

## 2020-05-17 DIAGNOSIS — Z03818 Encounter for observation for suspected exposure to other biological agents ruled out: Secondary | ICD-10-CM | POA: Diagnosis not present

## 2020-07-12 DIAGNOSIS — Z20822 Contact with and (suspected) exposure to covid-19: Secondary | ICD-10-CM | POA: Diagnosis not present

## 2020-09-11 DIAGNOSIS — Z0000 Encounter for general adult medical examination without abnormal findings: Secondary | ICD-10-CM | POA: Diagnosis not present

## 2020-09-11 DIAGNOSIS — R7303 Prediabetes: Secondary | ICD-10-CM | POA: Diagnosis not present

## 2020-09-11 DIAGNOSIS — Z1159 Encounter for screening for other viral diseases: Secondary | ICD-10-CM | POA: Diagnosis not present

## 2020-09-11 DIAGNOSIS — Z1389 Encounter for screening for other disorder: Secondary | ICD-10-CM | POA: Diagnosis not present

## 2020-09-11 DIAGNOSIS — Z79899 Other long term (current) drug therapy: Secondary | ICD-10-CM | POA: Diagnosis not present

## 2020-09-11 DIAGNOSIS — I10 Essential (primary) hypertension: Secondary | ICD-10-CM | POA: Diagnosis not present

## 2020-09-12 DIAGNOSIS — X32XXXD Exposure to sunlight, subsequent encounter: Secondary | ICD-10-CM | POA: Diagnosis not present

## 2020-09-12 DIAGNOSIS — D225 Melanocytic nevi of trunk: Secondary | ICD-10-CM | POA: Diagnosis not present

## 2020-09-12 DIAGNOSIS — L570 Actinic keratosis: Secondary | ICD-10-CM | POA: Diagnosis not present

## 2020-09-12 DIAGNOSIS — D0462 Carcinoma in situ of skin of left upper limb, including shoulder: Secondary | ICD-10-CM | POA: Diagnosis not present

## 2020-09-12 DIAGNOSIS — Z1283 Encounter for screening for malignant neoplasm of skin: Secondary | ICD-10-CM | POA: Diagnosis not present

## 2020-09-12 DIAGNOSIS — C44712 Basal cell carcinoma of skin of right lower limb, including hip: Secondary | ICD-10-CM | POA: Diagnosis not present

## 2020-11-15 DIAGNOSIS — H5203 Hypermetropia, bilateral: Secondary | ICD-10-CM | POA: Diagnosis not present

## 2020-11-15 DIAGNOSIS — H1789 Other corneal scars and opacities: Secondary | ICD-10-CM | POA: Diagnosis not present

## 2020-11-15 DIAGNOSIS — Z961 Presence of intraocular lens: Secondary | ICD-10-CM | POA: Diagnosis not present

## 2020-11-15 DIAGNOSIS — H2512 Age-related nuclear cataract, left eye: Secondary | ICD-10-CM | POA: Diagnosis not present

## 2021-02-01 DIAGNOSIS — N3001 Acute cystitis with hematuria: Secondary | ICD-10-CM | POA: Diagnosis not present

## 2021-03-13 DIAGNOSIS — R7303 Prediabetes: Secondary | ICD-10-CM | POA: Diagnosis not present

## 2021-03-13 DIAGNOSIS — I10 Essential (primary) hypertension: Secondary | ICD-10-CM | POA: Diagnosis not present

## 2021-06-14 DIAGNOSIS — L989 Disorder of the skin and subcutaneous tissue, unspecified: Secondary | ICD-10-CM | POA: Diagnosis not present

## 2021-06-14 DIAGNOSIS — I10 Essential (primary) hypertension: Secondary | ICD-10-CM | POA: Diagnosis not present

## 2021-06-28 DIAGNOSIS — L258 Unspecified contact dermatitis due to other agents: Secondary | ICD-10-CM | POA: Diagnosis not present

## 2021-10-08 DIAGNOSIS — M5432 Sciatica, left side: Secondary | ICD-10-CM | POA: Diagnosis not present

## 2021-10-08 DIAGNOSIS — Z79899 Other long term (current) drug therapy: Secondary | ICD-10-CM | POA: Diagnosis not present

## 2021-10-08 DIAGNOSIS — Z0000 Encounter for general adult medical examination without abnormal findings: Secondary | ICD-10-CM | POA: Diagnosis not present

## 2021-10-08 DIAGNOSIS — H903 Sensorineural hearing loss, bilateral: Secondary | ICD-10-CM | POA: Diagnosis not present

## 2021-10-08 DIAGNOSIS — I10 Essential (primary) hypertension: Secondary | ICD-10-CM | POA: Diagnosis not present

## 2021-10-08 DIAGNOSIS — R7303 Prediabetes: Secondary | ICD-10-CM | POA: Diagnosis not present

## 2021-12-18 DIAGNOSIS — H903 Sensorineural hearing loss, bilateral: Secondary | ICD-10-CM | POA: Diagnosis not present

## 2022-04-08 DIAGNOSIS — I10 Essential (primary) hypertension: Secondary | ICD-10-CM | POA: Diagnosis not present

## 2022-04-08 DIAGNOSIS — R7309 Other abnormal glucose: Secondary | ICD-10-CM | POA: Diagnosis not present

## 2022-08-13 DIAGNOSIS — H52203 Unspecified astigmatism, bilateral: Secondary | ICD-10-CM | POA: Diagnosis not present

## 2022-08-13 DIAGNOSIS — H2512 Age-related nuclear cataract, left eye: Secondary | ICD-10-CM | POA: Diagnosis not present

## 2022-08-13 DIAGNOSIS — Z961 Presence of intraocular lens: Secondary | ICD-10-CM | POA: Diagnosis not present

## 2022-08-13 DIAGNOSIS — H43811 Vitreous degeneration, right eye: Secondary | ICD-10-CM | POA: Diagnosis not present

## 2022-08-13 DIAGNOSIS — H524 Presbyopia: Secondary | ICD-10-CM | POA: Diagnosis not present

## 2022-11-06 DIAGNOSIS — E119 Type 2 diabetes mellitus without complications: Secondary | ICD-10-CM | POA: Diagnosis not present

## 2022-11-06 DIAGNOSIS — M5441 Lumbago with sciatica, right side: Secondary | ICD-10-CM | POA: Diagnosis not present

## 2022-11-06 DIAGNOSIS — Z79899 Other long term (current) drug therapy: Secondary | ICD-10-CM | POA: Diagnosis not present

## 2022-11-06 DIAGNOSIS — G8929 Other chronic pain: Secondary | ICD-10-CM | POA: Diagnosis not present

## 2022-11-06 DIAGNOSIS — M5442 Lumbago with sciatica, left side: Secondary | ICD-10-CM | POA: Diagnosis not present

## 2022-11-06 DIAGNOSIS — I10 Essential (primary) hypertension: Secondary | ICD-10-CM | POA: Diagnosis not present

## 2022-11-06 DIAGNOSIS — Z0000 Encounter for general adult medical examination without abnormal findings: Secondary | ICD-10-CM | POA: Diagnosis not present

## 2022-11-06 DIAGNOSIS — H903 Sensorineural hearing loss, bilateral: Secondary | ICD-10-CM | POA: Diagnosis not present

## 2022-11-14 DIAGNOSIS — M5459 Other low back pain: Secondary | ICD-10-CM | POA: Diagnosis not present

## 2022-11-14 DIAGNOSIS — M5440 Lumbago with sciatica, unspecified side: Secondary | ICD-10-CM | POA: Diagnosis not present

## 2022-11-17 DIAGNOSIS — M5440 Lumbago with sciatica, unspecified side: Secondary | ICD-10-CM | POA: Diagnosis not present

## 2022-11-17 DIAGNOSIS — M5459 Other low back pain: Secondary | ICD-10-CM | POA: Diagnosis not present

## 2022-11-19 DIAGNOSIS — M5440 Lumbago with sciatica, unspecified side: Secondary | ICD-10-CM | POA: Diagnosis not present

## 2022-11-19 DIAGNOSIS — M5459 Other low back pain: Secondary | ICD-10-CM | POA: Diagnosis not present

## 2022-12-02 DIAGNOSIS — M5440 Lumbago with sciatica, unspecified side: Secondary | ICD-10-CM | POA: Diagnosis not present

## 2022-12-02 DIAGNOSIS — M5459 Other low back pain: Secondary | ICD-10-CM | POA: Diagnosis not present

## 2022-12-05 DIAGNOSIS — R748 Abnormal levels of other serum enzymes: Secondary | ICD-10-CM | POA: Diagnosis not present

## 2022-12-25 DIAGNOSIS — M5440 Lumbago with sciatica, unspecified side: Secondary | ICD-10-CM | POA: Diagnosis not present

## 2022-12-25 DIAGNOSIS — M5459 Other low back pain: Secondary | ICD-10-CM | POA: Diagnosis not present

## 2022-12-31 DIAGNOSIS — M5459 Other low back pain: Secondary | ICD-10-CM | POA: Diagnosis not present

## 2022-12-31 DIAGNOSIS — M5440 Lumbago with sciatica, unspecified side: Secondary | ICD-10-CM | POA: Diagnosis not present

## 2023-01-01 DIAGNOSIS — M5440 Lumbago with sciatica, unspecified side: Secondary | ICD-10-CM | POA: Diagnosis not present

## 2023-01-01 DIAGNOSIS — M5459 Other low back pain: Secondary | ICD-10-CM | POA: Diagnosis not present

## 2023-01-05 DIAGNOSIS — M5459 Other low back pain: Secondary | ICD-10-CM | POA: Diagnosis not present

## 2023-01-05 DIAGNOSIS — M5440 Lumbago with sciatica, unspecified side: Secondary | ICD-10-CM | POA: Diagnosis not present

## 2023-01-08 DIAGNOSIS — M5459 Other low back pain: Secondary | ICD-10-CM | POA: Diagnosis not present

## 2023-01-08 DIAGNOSIS — M5440 Lumbago with sciatica, unspecified side: Secondary | ICD-10-CM | POA: Diagnosis not present

## 2023-02-02 DIAGNOSIS — J069 Acute upper respiratory infection, unspecified: Secondary | ICD-10-CM | POA: Diagnosis not present

## 2023-04-09 DIAGNOSIS — C44319 Basal cell carcinoma of skin of other parts of face: Secondary | ICD-10-CM | POA: Diagnosis not present

## 2023-04-24 DIAGNOSIS — C44319 Basal cell carcinoma of skin of other parts of face: Secondary | ICD-10-CM | POA: Diagnosis not present

## 2023-05-05 DIAGNOSIS — M5441 Lumbago with sciatica, right side: Secondary | ICD-10-CM | POA: Diagnosis not present

## 2023-05-05 DIAGNOSIS — I10 Essential (primary) hypertension: Secondary | ICD-10-CM | POA: Diagnosis not present

## 2023-05-05 DIAGNOSIS — R7303 Prediabetes: Secondary | ICD-10-CM | POA: Diagnosis not present

## 2023-05-05 DIAGNOSIS — M5442 Lumbago with sciatica, left side: Secondary | ICD-10-CM | POA: Diagnosis not present

## 2023-05-07 DIAGNOSIS — M5451 Vertebrogenic low back pain: Secondary | ICD-10-CM | POA: Diagnosis not present

## 2023-05-20 DIAGNOSIS — C44319 Basal cell carcinoma of skin of other parts of face: Secondary | ICD-10-CM | POA: Diagnosis not present

## 2023-05-26 DIAGNOSIS — M545 Low back pain, unspecified: Secondary | ICD-10-CM | POA: Diagnosis not present

## 2023-05-26 DIAGNOSIS — M7071 Other bursitis of hip, right hip: Secondary | ICD-10-CM | POA: Diagnosis not present

## 2023-05-26 DIAGNOSIS — M47896 Other spondylosis, lumbar region: Secondary | ICD-10-CM | POA: Diagnosis not present

## 2023-05-28 DIAGNOSIS — M7071 Other bursitis of hip, right hip: Secondary | ICD-10-CM | POA: Diagnosis not present

## 2023-06-22 DIAGNOSIS — M545 Low back pain, unspecified: Secondary | ICD-10-CM | POA: Diagnosis not present

## 2023-06-22 DIAGNOSIS — M7071 Other bursitis of hip, right hip: Secondary | ICD-10-CM | POA: Diagnosis not present

## 2023-06-22 DIAGNOSIS — M47896 Other spondylosis, lumbar region: Secondary | ICD-10-CM | POA: Diagnosis not present

## 2023-06-24 DIAGNOSIS — M545 Low back pain, unspecified: Secondary | ICD-10-CM | POA: Diagnosis not present

## 2023-06-27 DIAGNOSIS — M545 Low back pain, unspecified: Secondary | ICD-10-CM | POA: Diagnosis not present

## 2023-06-30 DIAGNOSIS — M545 Low back pain, unspecified: Secondary | ICD-10-CM | POA: Diagnosis not present

## 2023-07-02 DIAGNOSIS — M545 Low back pain, unspecified: Secondary | ICD-10-CM | POA: Diagnosis not present

## 2023-07-07 DIAGNOSIS — M545 Low back pain, unspecified: Secondary | ICD-10-CM | POA: Diagnosis not present

## 2023-07-08 DIAGNOSIS — M545 Low back pain, unspecified: Secondary | ICD-10-CM | POA: Diagnosis not present

## 2023-07-08 DIAGNOSIS — M7071 Other bursitis of hip, right hip: Secondary | ICD-10-CM | POA: Diagnosis not present

## 2023-07-08 DIAGNOSIS — M47896 Other spondylosis, lumbar region: Secondary | ICD-10-CM | POA: Diagnosis not present

## 2023-07-09 DIAGNOSIS — M545 Low back pain, unspecified: Secondary | ICD-10-CM | POA: Diagnosis not present

## 2023-07-14 DIAGNOSIS — M545 Low back pain, unspecified: Secondary | ICD-10-CM | POA: Diagnosis not present

## 2023-09-23 DIAGNOSIS — M5416 Radiculopathy, lumbar region: Secondary | ICD-10-CM | POA: Diagnosis not present

## 2023-10-12 DIAGNOSIS — M47896 Other spondylosis, lumbar region: Secondary | ICD-10-CM | POA: Diagnosis not present

## 2023-10-12 DIAGNOSIS — M7071 Other bursitis of hip, right hip: Secondary | ICD-10-CM | POA: Diagnosis not present

## 2023-10-12 DIAGNOSIS — M545 Low back pain, unspecified: Secondary | ICD-10-CM | POA: Diagnosis not present

## 2023-11-12 DIAGNOSIS — Z79899 Other long term (current) drug therapy: Secondary | ICD-10-CM | POA: Diagnosis not present

## 2023-11-12 DIAGNOSIS — Z0000 Encounter for general adult medical examination without abnormal findings: Secondary | ICD-10-CM | POA: Diagnosis not present

## 2023-11-12 DIAGNOSIS — M5442 Lumbago with sciatica, left side: Secondary | ICD-10-CM | POA: Diagnosis not present

## 2023-11-12 DIAGNOSIS — I10 Essential (primary) hypertension: Secondary | ICD-10-CM | POA: Diagnosis not present

## 2023-11-12 DIAGNOSIS — H903 Sensorineural hearing loss, bilateral: Secondary | ICD-10-CM | POA: Diagnosis not present

## 2023-11-12 DIAGNOSIS — Z23 Encounter for immunization: Secondary | ICD-10-CM | POA: Diagnosis not present

## 2023-11-12 DIAGNOSIS — G8929 Other chronic pain: Secondary | ICD-10-CM | POA: Diagnosis not present

## 2023-11-12 DIAGNOSIS — E559 Vitamin D deficiency, unspecified: Secondary | ICD-10-CM | POA: Diagnosis not present

## 2023-11-12 DIAGNOSIS — E119 Type 2 diabetes mellitus without complications: Secondary | ICD-10-CM | POA: Diagnosis not present

## 2023-11-12 DIAGNOSIS — M5441 Lumbago with sciatica, right side: Secondary | ICD-10-CM | POA: Diagnosis not present

## 2023-12-07 DIAGNOSIS — M48061 Spinal stenosis, lumbar region without neurogenic claudication: Secondary | ICD-10-CM | POA: Diagnosis not present

## 2023-12-07 DIAGNOSIS — M47896 Other spondylosis, lumbar region: Secondary | ICD-10-CM | POA: Diagnosis not present

## 2023-12-10 DIAGNOSIS — E119 Type 2 diabetes mellitus without complications: Secondary | ICD-10-CM | POA: Diagnosis not present

## 2023-12-24 ENCOUNTER — Encounter (HOSPITAL_BASED_OUTPATIENT_CLINIC_OR_DEPARTMENT_OTHER): Payer: Self-pay | Admitting: Physical Therapy

## 2023-12-24 ENCOUNTER — Ambulatory Visit (HOSPITAL_BASED_OUTPATIENT_CLINIC_OR_DEPARTMENT_OTHER): Attending: Anesthesiology | Admitting: Physical Therapy

## 2023-12-24 ENCOUNTER — Other Ambulatory Visit: Payer: Self-pay

## 2023-12-24 DIAGNOSIS — M545 Low back pain, unspecified: Secondary | ICD-10-CM | POA: Insufficient documentation

## 2023-12-24 DIAGNOSIS — R2689 Other abnormalities of gait and mobility: Secondary | ICD-10-CM | POA: Insufficient documentation

## 2023-12-24 DIAGNOSIS — R293 Abnormal posture: Secondary | ICD-10-CM | POA: Diagnosis not present

## 2023-12-24 DIAGNOSIS — M5459 Other low back pain: Secondary | ICD-10-CM

## 2023-12-24 NOTE — Therapy (Signed)
 OUTPATIENT PHYSICAL THERAPY THORACOLUMBAR EVALUATION   Patient Name: Joanna Hays MRN: [id number] DOB:Jun 23, 1940, 83 y.o., female Today's Date: 12/24/2023  END OF SESSION:  PT End of Session - 12/24/23 1254     Visit Number 1    Date for Recertification  01/22/24    Authorization Type [employer] mcr    PT Start Time 1016    PT Stop Time 1100    PT Time Calculation (min) 44 min    Activity Tolerance Patient tolerated treatment well    Behavior During Therapy WFL for tasks assessed/performed          Past Medical History:  Diagnosis Date   Arthritis    Colovesical fistula    Diverticulosis    Dysrhythmia    hx of irregular heart beat when dehydrated per patient, saw Dr. 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   Hypertension    Past Surgical History:  Procedure Laterality Date   ABDOMINAL HYSTERECTOMY  07/2010   Robotic lap hyst, BSO, bil pelvic and peri-aortic LND   BREAST BIOPSY     left    CHOLECYSTECTOMY  1996   CYSTOSCOPY WITH INSERTION OF UROLIFT N/A 03/12/2018   Procedure: CYSTOSCOPY, BILATERAL PYELOGRAM, INSERTION OF FIREFLY, LEFT TEMPORARY STENT PLACEMENT;  Surgeon: Cam Morene ORN, MD;  Location: [facility];  Service: Urology;  Laterality: N/A;   DILATION AND CURETTAGE OF UTERUS     PARTIAL KNEE ARTHROPLASTY Left 01/19/2017   Procedure: UNICOMPARTMENTAL KNEE;  Surgeon: Rubie Kemps, MD;  Location: [facility];  Service: Orthopedics;  Laterality: Left;   TONSILLECTOMY AND ADENOIDECTOMY  age 12   Patient Active Problem List   Diagnosis Date Noted   Colovaginal fistula 03/12/2018   Dehydration 02/09/2017   Hypertension 02/09/2017   Arthritis 02/09/2017   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   Endometrial ca (HCC) 07/17/2011    PCP: Denise Roseann MD/ Tamra Sauers MD  REFERRING PROVIDER: Ozell Finney MD  REFERRING DIAG: M54.50 (ICD-10-CM) - Low back pain, unspecified    Rationale for Evaluation and Treatment: Rehabilitation  THERAPY DIAG:  Other low back pain  Abnormal posture  Other abnormalities of gait and mobility  ONSET DATE: chronic  SUBJECTIVE:                                                                                                                                                                                           SUBJECTIVE STATEMENT: Go to pool [facility] + days a week. Do an exercise program there.  Sometimes I go other days  by myself doing the program I used to do to [address]. I use meds as needed.  Not sure if the aquatic therapy is different.  I Have had 2 injection but they really didn't work  PERTINENT HISTORY:  right paramedian L5-S1 interlaminar epidural steroid injection on 09/23/23  Left partial knee replacement  PAIN:  Are you having pain? Yes: NPRS scale: current 0/10; 8/10 Pain location: . right side originates over the right buttock and radiates along the posterior aspect of the thigh to the knee Pain description: ache Aggravating factors: sitting depends on chair, toilet worst Relieving factors: water exercise, meds, hot shower  PRECAUTIONS: None  RED FLAGS: None   WEIGHT BEARING RESTRICTIONS: No  FALLS:  Has patient fallen in last 6 months? No  LIVING ENVIRONMENT: Lives with: lives with their spouse Lives in: Other Retirement community Stairs: No Has following equipment at home: None  OCCUPATION: retired  PLOF: Independent  PATIENT GOALS: exercises that may help, no regression  NEXT MD VISIT: 3 months  OBJECTIVE:  Note: Objective measures were completed at Evaluation unless otherwise noted.  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 L4-5 and L3-4 with severe spinal canal stenosis. L2-3 with moderate to severe spinal canal stenosis. L5-S1 moderate spinal canal stenosis with moderate right and moderate  to severe left foraminal stenosis.    X-ray lumbar spine (05/07/2023): 5 lumbar-type vertebral bodies with levoconvex lumbar scoliosis. Disc space narrowing throughout the lumbar spine, most notably at L5-S1. Facet arthrosis throughout the lumbar spine.   PATIENT SURVEYS:  ODI:14/50= 28%  COGNITION: Overall cognitive status: Within functional limits for tasks assessed     SENSATION: WFL  MUSCLE LENGTH: Hamstrings: WFL   POSTURE: rounded shoulders and forward head  PALPATION: No TTP  LUMBAR ROM:   wfl  LOWER EXTREMITY ROM:     wfl  LOWER EXTREMITY MMT:    MMT Right eval Left eval  Hip flexion 4+ 4-  Hip extension    Hip abduction 5 5  Hip adduction 5 5  Hip internal rotation    Hip external rotation    Knee flexion 5 5  Knee extension 5 5  Ankle dorsiflexion    Ankle plantarflexion    Ankle inversion    Ankle eversion     (Blank rows = not tested)    FUNCTIONAL TESTS:  Timed up and go (TUG): 22.91 (pt reports slowed cadence apprehensive of pool floor)  Item Test date: 11/6/ Date:  Date:   Sitting to standing 3. able to stand independently using hands Insert SmartPhrase OPRCBERGREEVAL Insert SmartPhrase OPRCBERGREEVAL  2. Standing unsupported 4. able to stand safely for 2 minutes    3. Sitting with back unsupported, feet supported 4. able to sit safely and securely for 2 minutes    4. Standing to sitting 4. sits safely with minimal use of hands    5. Pivot transfer  4. able to transfer safely with minor use of hands    6. Standing unsupported with eyes closed 3. able to stand 10 seconds with supervision    7. Standing unsupported with feet together 3. able to place feet together independently and stand 1 minute with supervision    8. Reaching forward with outstretched arms while standing 2. can reach forward 5 cm (2 inches)    9. Pick up object from the floor from standing 3. able to pick up slipper but needs supervision    10. Turning to  look behind over  left and right shoulders while standing 3. looks behind one side only, other side shows less weight shift    11. Turn 360 degrees 2. able to turn 360 degrees safely but slowly    12. Place alternate foot on step or stool while standing unsupported 1. able to complete > 2 steps needs minimal assist    13. Standing unsupported one foot in front 0. loses balance while stepping or standing    14. Standing on one leg 1. tries to lift leg unable to hold 3 seconds but remains standing independently.      Total Score 37/56 Total Score:    Total Score:      GAIT: Distance walked: 500 ft Assistive device utilized: None Level of assistance: Complete Independence Comments: Initiation of gait antalglic, reducing as gait progresses  TREATMENT  Eval Self care:Posture and optometrist instruction                                                                                                                               PATIENT EDUCATION:  Education details: Discussed eval findings, rehab rationale, aquatic program progression/POC and pools in area. Patient is in agreement  Person educated: Patient Education method: Explanation Education comprehension: verbalized understanding  HOME EXERCISE PROGRAM: Aquatics TBA  ASSESSMENT:  CLINICAL IMPRESSION: Patient is a 83 y.o. f who was seen today for physical therapy evaluation and treatment for chronic LBP.  She has dx of lumbar spinal stenosis and levoconvex lumbar scoliosis.  She has had various treatments over the last few years to manage conditions along with land based PT.  She reports it increased pain feeling worse when completing then when going in.  She is an active senior who maintains a regular exercise regimen at [facility] retirement community in which she lives which mostly consists of exercising in their pool completing water aerobic classes as well as indep walking and doing her own exercises. She is interested in being instructed on  other exercises that may help her back maintaining strength and progressing balance. She will benefit from a short episode of care to meet her goals.   OBJECTIVE IMPAIRMENTS: Abnormal gait, decreased activity tolerance, decreased ROM, decreased strength, and pain.   ACTIVITY LIMITATIONS: bending, sitting, and stairs  PARTICIPATION LIMITATIONS: cleaning, shopping, and community activity  PERSONAL FACTORS: Time since onset of injury/illness/exacerbation are also affecting patient's functional outcome.   REHAB POTENTIAL: Good  CLINICAL DECISION MAKING: Stable/uncomplicated  EVALUATION COMPLEXITY: Low   GOALS: Goals reviewed with patient? Yes  SHORT TERM GOALS: Target date: 01/22/24  Pt will tolerate full aquatic sessions consistently without increase in pain and with improving function to demonstrate good toleration and effectiveness of intervention.  Baseline: Goal status: INITIAL  2.  Pt will tolerate walking to and from setting (470ft) and tolerate full aquatic sessions without increase in pain or significant fatigue. Baseline:  Goal status: INITIAL  3.  PT to  identify and instruct pt on balance and core specific aquatic exercises that she is not currently completing to improve strength ans safety Baseline:  Goal status: INITIAL  4.  Pt will be indep with final Aquatic HEP for continued management of condition Baseline:  Goal status: INITIAL    LONG TERM GOALS: to be set as approp at re-cert  PLAN:  PT FREQUENCY: 1x/week  PT DURATION: 4 weeks  PLANNED INTERVENTIONS: 97164- PT Re-evaluation, 97750- Physical Performance Testing, 97110-Therapeutic exercises, 97530- Therapeutic activity, 97112- Neuromuscular re-education, 97535- Self Care, 02859- Manual therapy, [id number]- Gait training, [phone]- Aquatic Therapy, [phone]- Electrical stimulation (unattended), [phone]- Electrical stimulation (manual), [id number]- Ionotophoresis 4mg /ml Dexamethasone , 20560 (1-2 muscles), 20561 (3+ muscles)-  Dry Needling, Patient/Family education, Balance training, Stair training, Taping, Joint mobilization, DME instructions, Cryotherapy, and Moist heat.  PLAN FOR NEXT SESSION: aquatics only for instruction on core and balance exercises. HEP   Ronal Chipley) Clela Hagadorn MPT 12/24/23 1:18 PM [facility] [address] [place], [place], [address] Phone: [phone]   Fax:  [phone]  Date of referral: 10/17/23 Referring provider: Laqueta Ozell BIRCH, MD  Referring diagnosis? Other low back pain Treatment diagnosis? (if different than referring diagnosis) no  What was this (referring dx) caused by? Ongoing Issue and Arthritis  Lysle of Condition: Chronic (continuous duration > 3 months)   Laterality: Both  Current Functional Measure Score: Other ODI 14/50= 28%   Objective measurements identify impairments when they are compared to normal values, the uninvolved extremity, and prior level of function.  [x]  Yes  []  No  Objective assessment of functional ability: Minimal functional limitations   Briefly describe symptoms: LBP  right side originates over the right buttock and radiates along the posterior aspect of the thigh to the knee  How did symptoms start: chronic  Average pain intensity:  Last 24 hours: 3-4/10  Past week: 3-4/10  How often does the pt experience symptoms? Intermittently  How much have the symptoms interfered with usual daily activities? A little bit  How has condition changed since care began at this facility? NA - initial visit  In general, how is the patients overall health? Good   BACK PAIN (STarT Back Screening Tool) Has pain spread down the leg(s) at some time in the last 2 weeks? yes Has there been pain in the shoulder or neck at some time in the last 2 weeks? no Has the pt only walked short distances because of back pain? no Has patient dressed more slowly because of back pain in the past 2 weeks?  yes Does patient think it's not safe for a person with this condition to be physically active? no Does patient have worrying thoughts a lot of the time? no Does patient feel back pain is terrible and will never get any better? yes Has patient stopped enjoying things they usually enjoy? yes

## 2023-12-30 ENCOUNTER — Ambulatory Visit (HOSPITAL_BASED_OUTPATIENT_CLINIC_OR_DEPARTMENT_OTHER): Admitting: Physical Therapy

## 2023-12-30 ENCOUNTER — Encounter (HOSPITAL_BASED_OUTPATIENT_CLINIC_OR_DEPARTMENT_OTHER): Payer: Self-pay | Admitting: Physical Therapy

## 2023-12-30 DIAGNOSIS — M545 Low back pain, unspecified: Secondary | ICD-10-CM | POA: Diagnosis not present

## 2023-12-30 DIAGNOSIS — M5459 Other low back pain: Secondary | ICD-10-CM

## 2023-12-30 DIAGNOSIS — R293 Abnormal posture: Secondary | ICD-10-CM

## 2023-12-30 DIAGNOSIS — R2689 Other abnormalities of gait and mobility: Secondary | ICD-10-CM

## 2023-12-30 NOTE — Therapy (Signed)
 OUTPATIENT PHYSICAL THERAPY THORACOLUMBAR TREATMENT   Patient Name: Joanna Hays MRN: [id number] DOB:August 01, 1940, 83 y.o., female Today's Date: 12/30/2023  END OF SESSION:  PT End of Session - 12/30/23 1017     Visit Number 2    Date for Recertification  01/22/24    Authorization Type [employer] mcr $20 Copay    Authorization Time Period 8 visits approved  12/24/23-01/21/24    Authorization - Visit Number 2    Authorization - Number of Visits 8    PT Start Time 1016    PT Stop Time 1055    PT Time Calculation (min) 39 min    Activity Tolerance Patient tolerated treatment well    Behavior During Therapy WFL for tasks assessed/performed          Past Medical History:  Diagnosis Date   Arthritis    Colovesical fistula    Diverticulosis    Dysrhythmia    hx of irregular heart beat when dehydrated per patient, saw Dr. 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   Hypertension    Past Surgical History:  Procedure Laterality Date   ABDOMINAL HYSTERECTOMY  07/2010   Robotic lap hyst, BSO, bil pelvic and peri-aortic LND   BREAST BIOPSY     left    CHOLECYSTECTOMY  1996   CYSTOSCOPY WITH INSERTION OF UROLIFT N/A 03/12/2018   Procedure: CYSTOSCOPY, BILATERAL PYELOGRAM, INSERTION OF FIREFLY, LEFT TEMPORARY STENT PLACEMENT;  Surgeon: Cam Morene ORN, MD;  Location: [facility];  Service: Urology;  Laterality: N/A;   DILATION AND CURETTAGE OF UTERUS     PARTIAL KNEE ARTHROPLASTY Left 01/19/2017   Procedure: UNICOMPARTMENTAL KNEE;  Surgeon: Rubie Kemps, MD;  Location: [facility];  Service: Orthopedics;  Laterality: Left;   TONSILLECTOMY AND ADENOIDECTOMY  age 69   Patient Active Problem List   Diagnosis Date Noted   Colovaginal fistula 03/12/2018   Dehydration 02/09/2017   Hypertension 02/09/2017   Arthritis 02/09/2017   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  Back pain has been up/bad the last few days  Initial Subjective Go to pool at [facility] 3 + days a week. Do an exercise program there.  Sometimes I go other days by myself doing the program I used to do to [facility]. I use meds as needed.  Not sure if the aquatic therapy is different.  I Have had 2 injection but they really didn't work  PERTINENT HISTORY:  right paramedian L5-S1 interlaminar epidural steroid injection on 09/23/23  Left partial knee replacement  PAIN:  Are you having pain? Yes: NPRS scale: current 0/10; 8/10 Pain location: . right side originates over the right buttock and radiates along the posterior aspect of the thigh to the knee Pain description: ache Aggravating factors: sitting depends on chair, toilet worst Relieving factors: water exercise, meds, hot shower  PRECAUTIONS: None  RED FLAGS: None   WEIGHT BEARING RESTRICTIONS: No  FALLS:  Has patient fallen in last 6 months? No  LIVING ENVIRONMENT: Lives with: lives with their spouse Lives in: Other Retirement community Stairs: No Has following equipment at home: None  OCCUPATION: retired  PLOF: Independent  PATIENT GOALS: exercises that may help, no regression  NEXT MD VISIT: 3 months  OBJECTIVE:  Note: Objective measures were completed at Evaluation unless otherwise noted.  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 L4-5 and L3-4 with severe spinal canal stenosis. L2-3 with moderate to severe spinal canal stenosis. L5-S1 moderate spinal canal stenosis with moderate right and moderate to severe left foraminal stenosis.    X-ray lumbar spine (05/07/2023): 5 lumbar-type vertebral bodies with levoconvex lumbar scoliosis. Disc space narrowing throughout the lumbar spine, most notably at L5-S1. Facet arthrosis throughout the lumbar spine.   PATIENT SURVEYS:  ODI:14/50= 28%  COGNITION: Overall cognitive status: Within functional limits for tasks assessed     SENSATION: WFL  MUSCLE LENGTH: Hamstrings: WFL   POSTURE: rounded shoulders and forward head  PALPATION: No TTP  LUMBAR ROM:   wfl  LOWER EXTREMITY ROM:     wfl  LOWER EXTREMITY MMT:    MMT Right eval Left eval  Hip flexion 4+ 4-  Hip extension    Hip abduction 5 5  Hip adduction 5 5  Hip internal rotation    Hip external rotation    Knee flexion 5 5  Knee extension 5 5  Ankle dorsiflexion    Ankle plantarflexion    Ankle inversion    Ankle eversion     (Blank rows = not tested)    FUNCTIONAL TESTS:  Timed up and go (TUG): 22.91 (pt reports slowed cadence apprehensive of pool floor)  Item Test date: 11/6/ Date:  Date:   Sitting to standing 3. able to stand independently using hands Insert SmartPhrase OPRCBERGREEVAL Insert SmartPhrase OPRCBERGREEVAL  2. Standing unsupported 4. able to stand safely for 2 minutes    3. Sitting with back unsupported, feet supported 4. able to sit safely and securely for 2 minutes    4. Standing to sitting 4. sits safely with minimal use of hands    5. Pivot transfer  4. able to transfer safely with minor use of hands    6. Standing unsupported with eyes closed 3. able to stand 10 seconds with supervision    7. Standing unsupported with feet together 3. able to place feet  together independently  and stand 1 minute with supervision    8. Reaching forward with outstretched arms while standing 2. can reach forward 5 cm (2 inches)    9. Pick up object from the floor from standing 3. able to pick up slipper but needs supervision    10. Turning to look behind over left and right shoulders while standing 3. looks behind one side only, other side shows less weight shift    11. Turn 360 degrees 2. able to turn 360 degrees safely but slowly    12. Place alternate foot on step or stool while standing unsupported 1. able to complete > 2 steps needs minimal assist    13. Standing unsupported one foot in front 0. loses balance while stepping or standing    14. Standing on one leg 1. tries to lift leg unable to hold 3 seconds but remains standing independently.      Total Score 37/56 Total Score:    Total Score:      GAIT: Distance walked: 500 ft Assistive device utilized: None Level of assistance: Complete Independence Comments: Initiation of gait antalglic, reducing as gait progresses  TREATMENT  [facility] Adult PT Treatment:                                                DATE: 12/30/23 Pt seen for aquatic therapy today.  Treatment took place in water 3.5-4.75 ft in depth at the [facility] pool. Temp of water was 91.  Pt entered/exited the pool via stairs using step to pattern with hand rail.  *Intro to setting *walking forward, back and side stepping in 3.6 ft with unsupported *decompression position with noodle wrapped posteriorly across chest cycling->wrapped anteriorly ue support corner Hays *L stretch *seated on lift rest period *figure 4 stretch at stairs *PPT and hip hiking seated swing like on solid black noodle *walking between exercises for recovery   Pt requires the buoyancy and hydrostatic pressure of water for support, and to offload joints by unweighting joint load by at least 50 % in navel deep water and by at least 75-80% in chest to neck deep  water.  Viscosity of the water is needed for resistance of strengthening. Water current perturbations provides challenge to standing balance requiring increased core activation.                                                                                                                                  PATIENT EDUCATION:  Education details: Discussed eval findings, rehab rationale, aquatic program progression/POC and pools in area. Patient is in agreement  Person educated: Patient Education method: Explanation Education comprehension: verbalized understanding  HOME EXERCISE PROGRAM: Aquatics TBA  ASSESSMENT:  CLINICAL IMPRESSION: Pt demonstrates safety and independence in aquatic setting with therapist instructing from deck. Pt is  confident in setting, moving throughout all depths easily.  She is directed through various movement patterns and trials in both sitting and standing positions.   Pt is provided VC and demonstration throughout session for execution of exercises and while monitoring toleration.She does have an increase in pain sensitivity in right sided lb and into rle.  Pt reports this response is unusual for her (she uses [facility] pool).  She completed entire session then uses hot water massage in Jacuzzi (unbilled) in attempts to lower.  Goals are ongoing.    Initial Impression Patient is a 83 y.o. f who was seen today for physical therapy evaluation and treatment for chronic LBP.  She has dx of lumbar spinal stenosis and levoconvex lumbar scoliosis.  She has had various treatments over the last few years to manage conditions along with land based PT.  She reports it increased pain feeling worse when completing then when going in.  She is an active senior who maintains a regular exercise regimen at [facility] retirement community in which she lives which mostly consists of exercising in their pool completing water aerobic classes as well as indep walking and doing her own  exercises. She is interested in being instructed on other exercises that may help her back maintaining strength and progressing balance. She will benefit from a short episode of care to meet her goals.   OBJECTIVE IMPAIRMENTS: Abnormal gait, decreased activity tolerance, decreased ROM, decreased strength, and pain.   ACTIVITY LIMITATIONS: bending, sitting, and stairs  PARTICIPATION LIMITATIONS: cleaning, shopping, and community activity  PERSONAL FACTORS: Time since onset of injury/illness/exacerbation are also affecting patient's functional outcome.   REHAB POTENTIAL: Good  CLINICAL DECISION MAKING: Stable/uncomplicated  EVALUATION COMPLEXITY: Low   GOALS: Goals reviewed with patient? Yes  SHORT TERM GOALS: Target date: 01/22/24  Pt will tolerate full aquatic sessions consistently without increase in pain and with improving function to demonstrate good toleration and effectiveness of intervention.  Baseline: Goal status: INITIAL  2.  Pt will tolerate walking to and from setting (42ft) and tolerate full aquatic sessions without increase in pain or significant fatigue. Baseline:  Goal status: INITIAL  3.  PT to identify and instruct pt on balance and core specific aquatic exercises that she is not currently completing to improve strength ans safety Baseline:  Goal status: INITIAL  4.  Pt will be indep with final Aquatic HEP for continued management of condition Baseline:  Goal status: INITIAL    LONG TERM GOALS: to be set as approp at re-cert  PLAN:  PT FREQUENCY: 1x/week  PT DURATION: 4 weeks  PLANNED INTERVENTIONS: 97164- PT Re-evaluation, 97750- Physical Performance Testing, 97110-Therapeutic exercises, 97530- Therapeutic activity, 97112- Neuromuscular re-education, 97535- Self Care, [id number]- Manual therapy, [id number]- Gait training, [phone]- Aquatic Therapy, [phone]- Electrical stimulation (unattended), [phone]- Electrical stimulation (manual), [id number]- Ionotophoresis 4mg /ml  Dexamethasone , 79439 (1-2 muscles), 20561 (3+ muscles)- Dry Needling, Patient/Family education, Balance training, Stair training, Taping, Joint mobilization, DME instructions, Cryotherapy, and Moist heat.  PLAN FOR NEXT SESSION: aquatics only for instruction on core and balance exercises. HEP   Ronal [place]) Savier Trickett MPT 12/30/23 12:25 PM [facility] [address] [place], [place], [address] Phone: [phone]   Fax:  [phone]  Date of referral: 10/17/23 Referring provider: Laqueta Ozell BIRCH, MD  Referring diagnosis? Other low back pain Treatment diagnosis? (if different than referring diagnosis) no  What was this (referring dx) caused by? Ongoing Issue and Arthritis  Lysle of Condition: Chronic (continuous duration >  3 months)   Laterality: Both  Current Functional Measure Score: Other ODI 14/50= 28%   Objective measurements identify impairments when they are compared to normal values, the uninvolved extremity, and prior level of function.  [x]  Yes  []  No  Objective assessment of functional ability: Minimal functional limitations   Briefly describe symptoms: LBP  right side originates over the right buttock and radiates along the posterior aspect of the thigh to the knee  How did symptoms start: chronic  Average pain intensity:  Last 24 hours: 3-4/10  Past week: 3-4/10  How often does the pt experience symptoms? Intermittently  How much have the symptoms interfered with usual daily activities? A little bit  How has condition changed since care began at this facility? NA - initial visit  In general, how is the patients overall health? Good   BACK PAIN (STarT Back Screening Tool) Has pain spread down the leg(s) at some time in the last 2 weeks? yes Has there been pain in the shoulder or neck at some time in the last 2 weeks? no Has the pt only walked short distances because of back pain? no Has patient dressed  more slowly because of back pain in the past 2 weeks? yes Does patient think it's not safe for a person with this condition to be physically active? no Does patient have worrying thoughts a lot of the time? no Does patient feel back pain is terrible and will never get any better? yes Has patient stopped enjoying things they usually enjoy? yes

## 2024-01-06 ENCOUNTER — Ambulatory Visit (HOSPITAL_BASED_OUTPATIENT_CLINIC_OR_DEPARTMENT_OTHER): Admitting: Physical Therapy

## 2024-01-06 ENCOUNTER — Encounter (HOSPITAL_BASED_OUTPATIENT_CLINIC_OR_DEPARTMENT_OTHER): Payer: Self-pay | Admitting: Physical Therapy

## 2024-01-06 DIAGNOSIS — R293 Abnormal posture: Secondary | ICD-10-CM

## 2024-01-06 DIAGNOSIS — R2689 Other abnormalities of gait and mobility: Secondary | ICD-10-CM

## 2024-01-06 DIAGNOSIS — M5459 Other low back pain: Secondary | ICD-10-CM

## 2024-01-06 DIAGNOSIS — M545 Low back pain, unspecified: Secondary | ICD-10-CM | POA: Diagnosis not present

## 2024-01-06 NOTE — Therapy (Signed)
 OUTPATIENT PHYSICAL THERAPY THORACOLUMBAR TREATMENT   Patient Name: Joanna Hays MRN: [id number] DOB:[birth year], 83 y.o., female Today's Date: 01/06/2024  END OF SESSION:  PT End of Session - 01/06/24 1023     Visit Number 3    Date for Recertification  01/22/24    Authorization Type [employer] mcr $[id number] Copay    Authorization Time Period 8 visits approved  12/24/23-01/21/24    Authorization - Number of Visits 8    PT Start Time 1015    PT Stop Time 1055    PT Time Calculation (min) 40 min    Behavior During Therapy [facility] for tasks assessed/performed          Past Medical History:  Diagnosis Date   Arthritis    Colovesical fistula    Diverticulosis    Dysrhythmia    hx of irregular heart beat when dehydrated per patient, saw Dr. 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   Hypertension    Past Surgical History:  Procedure Laterality Date   ABDOMINAL HYSTERECTOMY  07/2010   Robotic lap hyst, BSO, bil pelvic and peri-aortic LND   BREAST BIOPSY     left    CHOLECYSTECTOMY  1996   CYSTOSCOPY WITH INSERTION OF UROLIFT N/A 03/12/2018   Procedure: CYSTOSCOPY, BILATERAL PYELOGRAM, INSERTION OF FIREFLY, LEFT TEMPORARY STENT PLACEMENT;  Surgeon: Cam Morene ORN, MD;  Location: [facility];  Service: Urology;  Laterality: N/A;   DILATION AND CURETTAGE OF UTERUS     PARTIAL KNEE ARTHROPLASTY Left 01/19/2017   Procedure: UNICOMPARTMENTAL KNEE;  Surgeon: Rubie Kemps, MD;  Location: [facility];  Service: Orthopedics;  Laterality: Left;   TONSILLECTOMY AND ADENOIDECTOMY  age 73   Patient Active Problem List   Diagnosis Date Noted   Colovaginal fistula 03/12/2018   Dehydration 02/09/2017   Hypertension 02/09/2017   Arthritis 02/09/2017   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   Endometrial ca (HCC) 07/17/2011    PCP: Denise Roseann MD/ Tamra Sauers MD  REFERRING PROVIDER: Ozell Finney MD  REFERRING  DIAG: M54.50 (ICD-10-CM) - Low back pain, unspecified   Rationale for Evaluation and Treatment: Rehabilitation  THERAPY DIAG:  Other low back pain  Abnormal posture  Other abnormalities of gait and mobility  ONSET DATE: chronic  SUBJECTIVE:                                                                                                                                                                                           SUBJECTIVE STATEMENT: Pt report that crossing leg in pool irritated her  hip/back on both sides (fig 4 stretch).  Pain was worse later in day after last session; had to take oxycodone  to relieve pain. Today is not a good day  Initial Subjective [facility] + days a week. Do an exercise program there.  Sometimes I go other days by myself doing the program I used to do to [place]. I use meds as needed.  Not sure if the aquatic therapy is different.  I Have had 2 injection but they really didn't work  PERTINENT HISTORY:  right paramedian L5-S1 interlaminar epidural steroid injection on 09/23/23  Left partial knee replacement  PAIN:  Are you having pain? Yes: NPRS scale: 5/10 Pain location: . Right buttock and radiates along the posterior aspect of the thigh to the knee Pain description: ache Aggravating factors: sitting depends on chair, toilet worst Relieving factors: water exercise, meds, hot shower  PRECAUTIONS: None  RED FLAGS: None   WEIGHT BEARING RESTRICTIONS: No  FALLS:  Has patient fallen in last 6 months? No  LIVING ENVIRONMENT: Lives with: lives with their spouse Lives in: Other Retirement community Stairs: No Has following equipment at home: None  OCCUPATION: retired  PLOF: Independent  PATIENT GOALS: exercises that may help, no regression  NEXT MD VISIT: 3 months  OBJECTIVE:  Note: Objective measures were completed at Evaluation unless otherwise noted.  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 L4-5 and L3-4 with severe spinal canal stenosis. L2-3 with moderate to severe spinal canal stenosis. L5-S1 moderate spinal canal stenosis with moderate right and moderate to severe left foraminal stenosis.    X-ray lumbar spine (05/07/2023): 5 lumbar-type vertebral bodies with levoconvex lumbar scoliosis. Disc space narrowing throughout the lumbar spine, most notably at L5-S1. Facet arthrosis throughout the lumbar spine.   PATIENT SURVEYS:  ODI:14/50= 28%  COGNITION: Overall cognitive status: Within functional limits for tasks assessed     SENSATION: WFL  MUSCLE LENGTH: Hamstrings: WFL   POSTURE: rounded shoulders and forward head  PALPATION: No TTP  LUMBAR ROM:   wfl  LOWER EXTREMITY ROM:     wfl  LOWER EXTREMITY MMT:    MMT Right eval Left eval  Hip flexion 4+ 4-  Hip extension    Hip abduction 5 5  Hip adduction 5 5  Hip internal rotation    Hip external rotation    Knee flexion 5 5  Knee extension 5 5  Ankle dorsiflexion    Ankle plantarflexion    Ankle inversion    Ankle eversion     (Blank rows = not tested)    FUNCTIONAL TESTS:  Timed up and go (TUG): 22.91 (pt reports slowed cadence apprehensive of pool floor)  Item Test date: 11/6/ Date:  Date:   Sitting to standing 3. able to stand independently using hands Insert SmartPhrase OPRCBERGREEVAL Insert SmartPhrase OPRCBERGREEVAL  2. Standing unsupported 4. able to stand safely for 2 minutes    3. Sitting with back unsupported, feet supported 4. able to sit safely and securely for 2 minutes    4. Standing to sitting 4. sits safely with minimal use of hands    5. Pivot transfer  4. able to transfer safely with minor use of hands    6. Standing unsupported with eyes closed 3. able to stand 10 seconds with supervision    7. Standing unsupported with feet together 3. able to place feet together independently and stand 1 minute  with  supervision    8. Reaching forward with outstretched arms while standing 2. can reach forward 5 cm (2 inches)    9. Pick up object from the floor from standing 3. able to pick up slipper but needs supervision    10. Turning to look behind over left and right shoulders while standing 3. looks behind one side only, other side shows less weight shift    11. Turn 360 degrees 2. able to turn 360 degrees safely but slowly    12. Place alternate foot on step or stool while standing unsupported 1. able to complete > 2 steps needs minimal assist    13. Standing unsupported one foot in front 0. loses balance while stepping or standing    14. Standing on one leg 1. tries to lift leg unable to hold 3 seconds but remains standing independently.      Total Score 37/56 Total Score:    Total Score:      GAIT: Distance walked: 500 ft Assistive device utilized: None Level of assistance: Complete Independence Comments: Initiation of gait antalglic, reducing as gait progresses  TREATMENT  [facility] Adult PT Treatment:                                                DATE: 01/06/24 Pt seen for aquatic therapy today.  Treatment took place in water 3.5-4.75 ft in depth at the [facility] pool. Temp of water was 91.  Pt entered/exited the pool via stairs using step to pattern with hand rail.  *unsupported: walking forward/ backward and side stepping- rest breaks as needed stretching back into flexion *decompression position with noodle wrapped posteriorly across chest cycling, hip abdct/add, hip flexion/extension, cycling * plank at bench with gentle hip extension x 5 each LE, fire hydrants x 5 each LE  (pain reduced at completion) * TrA set with single/bil rainbow pull down to front of thighs x 5 * side stepping with arm add/ abdct with rainbow hand floats * UE on wall:  hip circles CW/CCW 5 each direction, each LE (increased R glute pain) * return to walking backwards * pt shown self massage with ball to Rt  glute; returned demo with cues  Pt requires the buoyancy and hydrostatic pressure of water for support, and to offload joints by unweighting joint load by at least 50 % in navel deep water and by at least 75-80% in chest to neck deep water.  Viscosity of the water is needed for resistance of strengthening. Water current perturbations provides challenge to standing balance requiring increased core activation.                                                                                                                                  PATIENT EDUCATION:  Education details:  intro to aquatic therapy  Person educated: Patient Education method: Explanation Education comprehension: verbalized understanding  HOME EXERCISE PROGRAM: Aquatics TBA  ASSESSMENT:  CLINICAL IMPRESSION: Pt reported increase in R glute/LE pain after completing warm up of forward walking.  She is observed coming into forward flexed position often as it is her position of comfort/relief.  She did report slight reduction of pain with suspended cycling and after completing plank position at bench.  Overall pain less in RLE/glute upon completion than at beginning of session.   Goals are ongoing.    Initial Impression Patient is a 83 y.o. f who was seen today for physical therapy evaluation and treatment for chronic LBP.  She has dx of lumbar spinal stenosis and levoconvex lumbar scoliosis.  She has had various treatments over the last few years to manage conditions along with land based PT.  She reports it increased pain feeling worse when completing then when going in.  She is an active senior who maintains a regular exercise regimen at [facility] retirement community in which she lives which mostly consists of exercising in their pool completing water aerobic classes as well as indep walking and doing her own exercises. She is interested in being instructed on other exercises that may help her back maintaining strength and  progressing balance. She will benefit from a short episode of care to meet her goals.   OBJECTIVE IMPAIRMENTS: Abnormal gait, decreased activity tolerance, decreased ROM, decreased strength, and pain.   ACTIVITY LIMITATIONS: bending, sitting, and stairs  PARTICIPATION LIMITATIONS: cleaning, shopping, and community activity  PERSONAL FACTORS: Time since onset of injury/illness/exacerbation are also affecting patient's functional outcome.   REHAB POTENTIAL: Good  CLINICAL DECISION MAKING: Stable/uncomplicated  EVALUATION COMPLEXITY: Low   GOALS: Goals reviewed with patient? Yes  SHORT TERM GOALS: Target date: 01/22/24  Pt will tolerate full aquatic sessions consistently without increase in pain and with improving function to demonstrate good toleration and effectiveness of intervention.  Baseline: Goal status: INITIAL  2.  Pt will tolerate walking to and from setting (411ft) and tolerate full aquatic sessions without increase in pain or significant fatigue. Baseline:  Goal status: INITIAL  3.  PT to identify and instruct pt on balance and core specific aquatic exercises that she is not currently completing to improve strength ans safety Baseline:  Goal status: INITIAL  4.  Pt will be indep with final Aquatic HEP for continued management of condition Baseline:  Goal status: INITIAL    LONG TERM GOALS: to be set as approp at re-cert  PLAN:  PT FREQUENCY: 1x/week  PT DURATION: 4 weeks  PLANNED INTERVENTIONS: 97164- PT Re-evaluation, 97750- Physical Performance Testing, 97110-Therapeutic exercises, 97530- Therapeutic activity, 97112- Neuromuscular re-education, 97535- Self Care, 02859- Manual therapy, [id number]- Gait training, [id number]- Aquatic Therapy, [phone]- Electrical stimulation (unattended), [phone]- Electrical stimulation (manual), [id number]- Ionotophoresis 4mg /ml Dexamethasone , 79439 (1-2 muscles), 20561 (3+ muscles)- Dry Needling, Patient/Family education, Balance training, Stair  training, Taping, Joint mobilization, DME instructions, Cryotherapy, and Moist heat.  PLAN FOR NEXT SESSION: aquatics only for instruction on core and balance exercises. HEP  Delon Aquas, [place] 01/06/24 10:59 AM [facility] [address] [place], [place], [address] Phone: [phone]   Fax:  [phone]   Date of referral: 10/17/23 Referring provider: Laqueta Ozell BIRCH, MD  Referring diagnosis? Other low back pain Treatment diagnosis? (if different than referring diagnosis) no  What was this (referring dx) caused by? Ongoing Issue and Arthritis  Lysle of Condition: Chronic (continuous  duration > 3 months)   Laterality: Both  Current Functional Measure Score: Other ODI 14/50= 28%   Objective measurements identify impairments when they are compared to normal values, the uninvolved extremity, and prior level of function.  [x]  Yes  []  No  Objective assessment of functional ability: Minimal functional limitations   Briefly describe symptoms: LBP  right side originates over the right buttock and radiates along the posterior aspect of the thigh to the knee  How did symptoms start: chronic  Average pain intensity:  Last 24 hours: 3-4/10  Past week: 3-4/10  How often does the pt experience symptoms? Intermittently  How much have the symptoms interfered with usual daily activities? A little bit  How has condition changed since care began at this facility? NA - initial visit  In general, how is the patients overall health? Good   BACK PAIN (STarT Back Screening Tool) Has pain spread down the leg(s) at some time in the last 2 weeks? yes Has there been pain in the shoulder or neck at some time in the last 2 weeks? no Has the pt only walked short distances because of back pain? no Has patient dressed more slowly because of back pain in the past 2 weeks? yes Does patient think it's not safe for a person with this  condition to be physically active? no Does patient have worrying thoughts a lot of the time? no Does patient feel back pain is terrible and will never get any better? yes Has patient stopped enjoying things they usually enjoy? yes

## 2024-01-13 ENCOUNTER — Encounter (HOSPITAL_BASED_OUTPATIENT_CLINIC_OR_DEPARTMENT_OTHER): Payer: Self-pay | Admitting: Physical Therapy

## 2024-01-13 ENCOUNTER — Ambulatory Visit (HOSPITAL_BASED_OUTPATIENT_CLINIC_OR_DEPARTMENT_OTHER): Admitting: Physical Therapy

## 2024-01-13 DIAGNOSIS — R293 Abnormal posture: Secondary | ICD-10-CM

## 2024-01-13 DIAGNOSIS — M5459 Other low back pain: Secondary | ICD-10-CM

## 2024-01-13 DIAGNOSIS — M545 Low back pain, unspecified: Secondary | ICD-10-CM | POA: Diagnosis not present

## 2024-01-13 DIAGNOSIS — R2689 Other abnormalities of gait and mobility: Secondary | ICD-10-CM

## 2024-01-13 NOTE — Therapy (Signed)
 OUTPATIENT PHYSICAL THERAPY THORACOLUMBAR TREATMENT PHYSICAL THERAPY DISCHARGE SUMMARY  Visits from Start of Care: 4  Current functional level related to goals / functional outcomes: Indep but with pain   Remaining deficits: Chronic pain   Education / Equipment: Management of condition/ HEP   Patient agrees to discharge. Patient goals were Met. Patient is being discharged due to the patient's request.and goals met   Patient Name: Joanna Hays MRN: [id number] DOB:1940-09-10, 83 y.o., female Today's Date: 01/13/2024  END OF SESSION:  PT End of Session - 01/13/24 1015     Visit Number 4    Date for Recertification  01/22/24    Authorization Type [employer] mcr $[id number] Copay    Authorization Time Period 8 visits approved  12/24/23-01/21/24    Authorization - Visit Number 4    Authorization - Number of Visits 8    PT Start Time 1015    PT Stop Time 1055    PT Time Calculation (min) 40 min    Activity Tolerance Patient tolerated treatment well    Behavior During Therapy [facility] for tasks assessed/performed          Past Medical History:  Diagnosis Date   Arthritis    Colovesical fistula    Diverticulosis    Dysrhythmia    hx of irregular heart beat when dehydrated per patient, saw Dr. 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   Hypertension    Past Surgical History:  Procedure Laterality Date   ABDOMINAL HYSTERECTOMY  07/2010   Robotic lap hyst, BSO, bil pelvic and peri-aortic LND   BREAST BIOPSY     left    CHOLECYSTECTOMY  1996   CYSTOSCOPY WITH INSERTION OF UROLIFT N/A 03/12/2018   Procedure: CYSTOSCOPY, BILATERAL PYELOGRAM, INSERTION OF FIREFLY, LEFT TEMPORARY STENT PLACEMENT;  Surgeon: Cam Morene ORN, MD;  Location: [facility];  Service: Urology;  Laterality: N/A;   DILATION AND CURETTAGE OF UTERUS     PARTIAL KNEE ARTHROPLASTY Left 01/19/2017   Procedure: UNICOMPARTMENTAL KNEE;  Surgeon: Rubie Kemps, MD;   Location: [facility] OR;  Service: Orthopedics;  Laterality: Left;   TONSILLECTOMY AND ADENOIDECTOMY  age 83   Patient Active Problem List   Diagnosis Date Noted   Colovaginal fistula 03/12/2018   Dehydration 02/09/2017   Hypertension 02/09/2017   Arthritis 02/09/2017   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 . It will get better in the pool.  [facility] pool is open and working. I think I can do this on my own, I know what to do.    Initial Subjective Go to pool at [facility] 3 + days a week. Do an exercise program there.  Sometimes I go other days by myself doing the program I used to do to [place]. I use meds as needed.  Not sure if the aquatic therapy is different.  I Have had 2 injection but they really didn't work  PERTINENT HISTORY:  right paramedian L5-S1 interlaminar epidural steroid injection on 09/23/23  Left partial knee replacement  PAIN:  Are you having pain? Yes: NPRS scale: 5/10 Pain location: . Right buttock and radiates along the posterior aspect of the thigh to the knee Pain description: ache Aggravating factors: sitting depends on chair, toilet worst Relieving factors: water exercise, meds, hot shower  PRECAUTIONS: None  RED FLAGS: None   WEIGHT BEARING RESTRICTIONS: No  FALLS:  Has patient fallen in last 6 months? No  LIVING  ENVIRONMENT: Lives with: lives with their spouse Lives in: Other Retirement community Stairs: No Has following equipment at home: None  OCCUPATION: retired  PLOF: Independent  PATIENT GOALS: exercises that may help, no regression  NEXT MD VISIT: 3 months  OBJECTIVE:  Note: Objective measures were completed at Evaluation unless otherwise noted.  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 L4-5 and L3-4 with severe spinal canal stenosis. L2-3 with moderate to severe spinal canal stenosis. L5-S1 moderate spinal canal stenosis with moderate right and moderate to severe left foraminal stenosis.    X-ray lumbar spine (05/07/2023): 5 lumbar-type vertebral bodies with levoconvex lumbar scoliosis. Disc space narrowing throughout the lumbar spine, most notably at L5-S1. Facet arthrosis throughout the lumbar spine.   PATIENT SURVEYS:  ODI:14/50= 28%  COGNITION: Overall cognitive status: Within functional limits for tasks assessed     SENSATION: WFL  MUSCLE LENGTH: Hamstrings: WFL   POSTURE: rounded shoulders and forward head  PALPATION: No TTP  LUMBAR ROM:   wfl  LOWER EXTREMITY ROM:     wfl  LOWER EXTREMITY MMT:    MMT Right eval Left eval  Hip flexion 4+ 4-  Hip extension    Hip abduction 5 5  Hip adduction 5 5  Hip internal rotation    Hip external rotation    Knee flexion 5 5  Knee extension 5 5  Ankle dorsiflexion    Ankle plantarflexion    Ankle inversion    Ankle eversion     (Blank rows = not tested)    FUNCTIONAL TESTS:  Timed up and go (TUG): 22.91 (pt reports slowed cadence apprehensive of pool floor)  Item Test date: 11/6/ Date:  Date:   Sitting to standing 3. able to stand independently using hands Insert SmartPhrase OPRCBERGREEVAL Insert SmartPhrase OPRCBERGREEVAL  2. Standing unsupported 4. able to stand safely for 2 minutes    3. Sitting with back  unsupported, feet supported 4. able to sit safely and securely for 2 minutes    4. Standing to sitting 4. sits safely with minimal use of hands    5. Pivot transfer  4. able to transfer safely with minor use of hands    6. Standing unsupported with eyes closed 3. able to stand 10 seconds with supervision    7. Standing unsupported with feet together 3. able to  place feet together independently and stand 1 minute with supervision    8. Reaching forward with outstretched arms while standing 2. can reach forward 5 cm (2 inches)    9. Pick up object from the floor from standing 3. able to pick up slipper but needs supervision    10. Turning to look behind over left and right shoulders while standing 3. looks behind one side only, other side shows less weight shift    11. Turn 360 degrees 2. able to turn 360 degrees safely but slowly    12. Place alternate foot on step or stool while standing unsupported 1. able to complete > 2 steps needs minimal assist    13. Standing unsupported one foot in front 0. loses balance while stepping or standing    14. Standing on one leg 1. tries to lift leg unable to hold 3 seconds but remains standing independently.      Total Score 37/56 Total Score:    Total Score:      GAIT: Distance walked: 500 ft Assistive device utilized: None Level of assistance: Complete Independence Comments: Initiation of gait antalglic, reducing as gait progresses  TREATMENT  [facility] Adult PT Treatment:                                                DATE: 01/13/24 Pt seen for aquatic therapy today.  Treatment took place in water 3.5-4.75 ft in depth at the [facility] pool. Temp of water was 91.  Pt entered/exited the pool via stairs using step to pattern with hand rail.  *unsupported: walking forward/ backward and side stepping- rest breaks as needed stretching back into flexion *decompression position with noodle wrapped posteriorly across chest cycling (pt prefers vertical  suspension vs supine or reclined), hip abdct/add, hip flexion/extension, cycling *semiprone suspension ue support corner wall yellow noodle anterior across chest: (relieves LBP): hip add/abd; hip flex/ext; UKTC (mountain climbers) *Return to cycling; hip ext/flex; add/abd and knees to chest with noodle wrapped in above position *BKTC stretch feet on wall   Pt requires the buoyancy and hydrostatic pressure of water for support, and to offload joints by unweighting joint load by at least 50 % in navel deep water and by at least 75-80% in chest to neck deep water.  Viscosity of the water is needed for resistance of strengthening. Water current perturbations provides challenge to standing balance requiring increased core activation.                                                                                                                                  PATIENT EDUCATION:  Education details: intro to aquatic therapy  Person educated: Patient Education method: Explanation Education comprehension: verbalized understanding  HOME EXERCISE PROGRAM: Aquatics TBA  ASSESSMENT:  CLINICAL IMPRESSION: Pt  arrives with high pain (unable to give me a number rating as she appears flustered). Spent majority of session attempting to reduce pain for toleration to other activities. Found slight lumbar extension is pain relieving.  Pt demonstrates complete comfort and confidence in setting.  She is well in tune with her body and follows instinctively movements and positions that relieve pain and improve her mobility.  She reports ~ 2 NPRS reduction in pain by end of session (is able to then rate at 5/10 from 8/10). Pt has decided not to continue with aquatic PT as she feels she can continue at her own pool indep.  PT in agreement. All goals met.  Initial Impression Patient is a 83 y.o. f who was seen today for physical therapy evaluation and treatment for chronic LBP.  She has dx of lumbar spinal  stenosis and levoconvex lumbar scoliosis.  She has had various treatments over the last few years to manage conditions along with land based PT.  She reports it increased pain feeling worse when completing then when going in.  She is an active senior who maintains a regular exercise regimen at [facility] retirement community in which she lives which mostly consists of exercising in their pool completing water aerobic classes as well as indep walking and doing her own exercises. She is interested in being instructed on other exercises that may help her back maintaining strength and progressing balance. She will benefit from a short episode of care to meet her goals.   OBJECTIVE IMPAIRMENTS: Abnormal gait, decreased activity tolerance, decreased ROM, decreased strength, and pain.   ACTIVITY LIMITATIONS: bending, sitting, and stairs  PARTICIPATION LIMITATIONS: cleaning, shopping, and community activity  PERSONAL FACTORS: Time since onset of injury/illness/exacerbation are also affecting patient's functional outcome.   REHAB POTENTIAL: Good  CLINICAL DECISION MAKING: Stable/uncomplicated  EVALUATION COMPLEXITY: Low   GOALS: Goals reviewed with patient? Yes  SHORT TERM GOALS: Target date: 01/22/24  Pt will tolerate full aquatic sessions consistently without increase in pain and with improving function to demonstrate good toleration and effectiveness of intervention.  Baseline: Goal status: Met 01/13/24  2.  Pt will tolerate walking to and from setting (478ft) and tolerate full aquatic sessions without increase in pain or significant fatigue. Baseline:  Goal status: Met 01/13/24  3.  PT to identify and instruct pt on balance and core specific aquatic exercises that she is not currently completing to improve strength ans safety Baseline:  Goal status: Met 01/13/24  4.  Pt will be indep with final Aquatic HEP for continued management of condition Baseline:  Goal status: Met 01/13/24  Pt  comfortable with indep aquatic HEP she follows at [facility].    LONG TERM GOALS: to be set as approp at re-cert  PLAN:  PT FREQUENCY: 1x/week  PT DURATION: 4 weeks  PLANNED INTERVENTIONS: 97164- PT Re-evaluation, 97750- Physical Performance Testing, 97110-Therapeutic exercises, 97530- Therapeutic activity, 97112- Neuromuscular re-education, 97535- Self Care, 02859- Manual therapy, [phone]- Gait training, [phone]- Aquatic Therapy, [phone]- Electrical stimulation (unattended), [phone]- Electrical stimulation (manual), [id number]- Ionotophoresis 4mg /ml Dexamethasone , 20560 (1-2 muscles), 20561 (3+ muscles)- Dry Needling, Patient/Family education, Balance training, Stair training, Taping, Joint mobilization, DME instructions, Cryotherapy, and Moist heat.  PLAN FOR NEXT SESSION: aquatics only for instruction on core and balance exercises. HEP  [place]) Keilan Nichol MPT 01/13/24 12:32 PM [facility] [address] [place], [place], [address] Phone: [phone]   Fax:  [phone]    Date of referral: 10/17/23 Referring  provider: Laqueta Ozell BIRCH, MD  Referring diagnosis? Other low back pain Treatment diagnosis? (if different than referring diagnosis) no  What was this (referring dx) caused by? Ongoing Issue and Arthritis  Lysle of Condition: Chronic (continuous duration > 3 months)   Laterality: Both  Current Functional Measure Score: Other ODI 14/50= 28%   Objective measurements identify impairments when they are compared to normal values, the uninvolved extremity, and prior level of function.  [x]  Yes  []  No  Objective assessment of functional ability: Minimal functional limitations   Briefly describe symptoms: LBP  right side originates over the right buttock and radiates along the posterior aspect of the thigh to the knee  How did symptoms start: chronic  Average pain intensity:  Last 24 hours: 3-4/10  Past week: 3-4/10  How  often does the pt experience symptoms? Intermittently  How much have the symptoms interfered with usual daily activities? A little bit  How has condition changed since care began at this facility? NA - initial visit  In general, how is the patients overall health? Good   BACK PAIN (STarT Back Screening Tool) Has pain spread down the leg(s) at some time in the last 2 weeks? yes Has there been pain in the shoulder or neck at some time in the last 2 weeks? no Has the pt only walked short distances because of back pain? no Has patient dressed more slowly because of back pain in the past 2 weeks? yes Does patient think it's not safe for a person with this condition to be physically active? no Does patient have worrying thoughts a lot of the time? no Does patient feel back pain is terrible and will never get any better? yes Has patient stopped enjoying things they usually enjoy? yes
